# Patient Record
Sex: Female | Born: 2000
Health system: Southern US, Community
[De-identification: ages and names within clinical notes are randomized; demographics above are authoritative.]

## PROBLEM LIST (undated history)

## (undated) DIAGNOSIS — Z789 Other specified health status: Secondary | ICD-10-CM

## (undated) DIAGNOSIS — D649 Anemia, unspecified: Secondary | ICD-10-CM

## (undated) DIAGNOSIS — F419 Anxiety disorder, unspecified: Secondary | ICD-10-CM

## (undated) DIAGNOSIS — O4400 Placenta previa specified as without hemorrhage, unspecified trimester: Secondary | ICD-10-CM

## (undated) DIAGNOSIS — N390 Urinary tract infection, site not specified: Secondary | ICD-10-CM

## (undated) DIAGNOSIS — O24419 Gestational diabetes mellitus in pregnancy, unspecified control: Secondary | ICD-10-CM

## (undated) HISTORY — PX: NO PAST SURGERIES: SHX2092

## (undated) HISTORY — DX: Other specified health status: Z78.9

---

## 2020-08-23 ENCOUNTER — Ambulatory Visit (INDEPENDENT_AMBULATORY_CARE_PROVIDER_SITE_OTHER): Payer: No Typology Code available for payment source | Admitting: *Deleted

## 2020-08-23 ENCOUNTER — Other Ambulatory Visit: Payer: Self-pay

## 2020-08-23 VITALS — BP 117/76 | HR 96 | Temp 98.7°F | Wt 155.0 lb

## 2020-08-23 DIAGNOSIS — Z3400 Encounter for supervision of normal first pregnancy, unspecified trimester: Secondary | ICD-10-CM | POA: Diagnosis not present

## 2020-08-23 DIAGNOSIS — O0990 Supervision of high risk pregnancy, unspecified, unspecified trimester: Secondary | ICD-10-CM | POA: Insufficient documentation

## 2020-08-23 DIAGNOSIS — Z3201 Encounter for pregnancy test, result positive: Secondary | ICD-10-CM

## 2020-08-23 LAB — POCT URINE PREGNANCY: Preg Test, Ur: POSITIVE — AB

## 2020-08-23 NOTE — Patient Instructions (Signed)
AREA PEDIATRIC/FAMILY PRACTICE PHYSICIANS  [facility] [address] [place], [place] [address] Phone - [phone]   Fax - [phone]  [facility] [address] [place], [place]  [address] Phone - [phone]   Fax - [phone]  [facility] [address] [place], [place]  [address] Phone - [phone]   Fax - [phone]  [facility] [address] [place], [place]  [address] Phone - [phone]   Fax - [phone]  [facility] [address], [address] [place], [place]  [address] Phone - [phone]   Fax - [phone]  [facility] [address] [place], [place]  [address] Phone - [phone]   Fax - [phone]  [facility] [address] [place], [place]  [address] Phone - [phone]   Fax - [phone]  [facility] [address], [address] [place], [place]  [address] Phone - [phone]   Fax - [phone]  [facility] [address] [place], [place]  [address] Phone - [phone]   Fax - [phone] [facility] [address] [place], [place]  [address] Phone - [phone]   Fax - [phone]  [facility] [address] [place], [place]  [address] Phone - [phone]   Fax - [phone]  [facility] [address] [place], [place]  [address] Phone - [phone]   Fax - [phone]  [facility] [address] [place], [place]  [address] Phone - [phone]   Fax - [phone]  [facility]IL[facility] GOSRANI [address] [place], [place]  [address] Phone - [phone]  [facility] [address] [place], [place]  [address] Phone - [phone]   Fax - [phone]  [facility] [address] [place], [place]  [address] Phone - [phone]   Fax - [phone]  [facility] [address] [place], [place]  [address] Phone - [phone]   Fax - [phone]  [facility] [address] [place], [place]  [address] Phone - [phone]   Fax - [phone]   [facility] [address] [place], [place]  [address] Phone - [phone]   Fax - [phone]  [facility] [address] [place], [place]  [address] Phone - [phone]   Fax - [phone]  DAV[facility] RUB[facility] [address] [place], [place]  [address] Phone - [phone]   Fax - [phone]  [facility] [address] [place], [place]  [address] Phone - [phone]   Fax - [phone]  [facility] [address] [place], [place]  [address] Phone - [phone]   Fax - [phone] [facility] [address] [place], [place]  [address] Phone - [phone]   Fax - [phone]  [facility] [address] [place], [place]  [address] Phone - [phone]   Fax - [phone]   [facility] [address] [place],   [address] Phone - [phone]   Fax - [phone]   

## 2020-08-23 NOTE — Progress Notes (Signed)
   Location: [facility] Patient: clinic with Husband Provider: clinic  PRENATAL INTAKE SUMMARY  Amber Alvarez presents today New OB Nurse Interview.  OB History     Gravida  1   Para      Term      Preterm      AB      Living         SAB      IAB      Ectopic      Multiple      Live Births             I have reviewed the patient's medical, obstetrical, social, and family histories, medications, and available lab results.  SUBJECTIVE She has no unusual complaints. Patient desires female providers only Husband is MD at [facility]  OBJECTIVE Initial Nurse interview for history (New OB)  EDD: 02/26/2021 by LMP GA: [redacted]w[redacted]d G1P0   GENERAL APPEARANCE: alert, well appearing, in no apparent distress, oriented to person, place and time   ASSESSMENT Positive UPT Normal pregnancy  PLAN Prenatal care:  [facility] Labs to be completed at next visit with Camelia Eng, CNM  Follow Up Instructions:   I discussed the assessment and treatment plan with the patient. The patient was provided an opportunity to ask questions and all were answered. The patient agreed with the plan and demonstrated an understanding of the instructions.   The patient was advised to call back or seek an in-person evaluation if the symptoms worsen or if the condition fails to improve as anticipated.  I provided 40 minutes of  face-to-face time during this encounter.  Clovis Pu, RN

## 2020-09-07 ENCOUNTER — Ambulatory Visit (INDEPENDENT_AMBULATORY_CARE_PROVIDER_SITE_OTHER): Payer: No Typology Code available for payment source

## 2020-09-07 ENCOUNTER — Other Ambulatory Visit: Payer: Self-pay

## 2020-09-07 VITALS — BP 109/68 | HR 114 | Ht 66.0 in | Wt 156.0 lb

## 2020-09-07 DIAGNOSIS — Z789 Other specified health status: Secondary | ICD-10-CM

## 2020-09-07 DIAGNOSIS — Z3400 Encounter for supervision of normal first pregnancy, unspecified trimester: Secondary | ICD-10-CM

## 2020-09-07 NOTE — Progress Notes (Signed)
Subjective:   Amber Alvarez is a 20 y.o. G1P0 at [redacted]w[redacted]d by LMP being seen today for her first obstetrical visit. Her obstetrical history is significant for  healthy female  and has Supervision of normal first pregnancy, antepartum on their problem list.. Patient does intend to breast feed. Pregnancy history fully reviewed. This is a desired pregnancy.  Patient reports no complaints.  HISTORY: OB History  Gravida Para Term Preterm AB Living  1 0 0 0 0 0  SAB IAB Ectopic Multiple Live Births  0 0 0 0 0    # Outcome Date GA Lbr Len/2nd Weight Sex Delivery Anes PTL Lv  1 Current            Past Medical History:  Diagnosis Date   Medical history non-contributory    Past Surgical History:  Procedure Laterality Date   NO PAST SURGERIES     History reviewed. No pertinent family history. Social History   Tobacco Use   Smoking status: Never    Passive exposure: Never   Smokeless tobacco: Never  Vaping Use   Vaping Use: Never used  Substance Use Topics   Alcohol use: Never   Drug use: Never   No Known Allergies Current Outpatient Medications on File Prior to Visit  Medication Sig Dispense Refill   Prenatal Vit-Fe Fumarate-FA (PRENATAL VITAMIN PO) Take 1 tablet by mouth daily.     No current facility-administered medications on file prior to visit.   Indications for ASA therapy (per uptodate) One of the following: Previous pregnancy with preeclampsia, especially early onset and with an adverse outcome No Multifetal gestation No Chronic hypertension No Type 1 or 2 diabetes mellitus No Chronic kidney disease No Autoimmune disease (antiphospholipid syndrome, systemic lupus erythematosus) No  Two or more of the following: Nulliparity Yes Obesity (body mass index >30 kg/m2) No Family history of preeclampsia in mother or sister No Age ?35 years No Sociodemographic characteristics (African American race, low socioeconomic level) No Personal risk factors (eg, previous  pregnancy with low birth weight or small for gestational age infant, previous adverse pregnancy outcome [eg, stillbirth], interval >10 years between pregnancies) No  Indications for early 1 hour GTT (per uptodate)  BMI >25 (>23 in Asian women) AND one of the following  Gestational diabetes mellitus in a previous pregnancy No Glycated hemoglobin ?5.7 percent (39 mmol/mol), impaired glucose tolerance, or impaired fasting glucose on previous testing No First-degree relative with diabetes No High-risk race/ethnicity (eg, African American, Latino, Native American, [place] American, Pacific Islander) Yes History of cardiovascular disease No Hypertension or on therapy for hypertension No High-density lipoprotein cholesterol level <35 mg/dL (7.82 mmol/L) and/or a triglyceride level >250 mg/dL (4.23 mmol/L) No Polycystic ovary syndrome No Physical inactivity No Other clinical condition associated with insulin resistance (eg, severe obesity, acanthosis nigricans) No Previous birth of an infant weighing ?4000 g No Previous stillbirth of unknown cause No Exam   Vitals:   09/07/20 1524 09/07/20 1533  BP: 109/68   Pulse: (!) 114   Weight: 156 lb (70.8 kg)   Height:  5\' 6"  (1.676 m)   Fetal Heart Rate (bpm): 143  Uterus:     Pelvic Exam: Perineum: deferred   Vulva: deferred   Vagina:  deferred   Cervix: deferred   Adnexa: deferred   Bony Pelvis: deferred  System: General: well-developed, well-nourished female in no acute distress   Breast:  deferred   Skin: normal coloration and turgor, no rashes   Neurologic: oriented, normal,  negative, normal mood   Extremities: normal strength, tone, and muscle mass, ROM of all joints is normal   HEENT PERRLA, extraocular movement intact and sclera clear, anicteric   Mouth/Teeth mucous membranes moist, dental hygiene good   Neck supple    Cardiovascular: regular rate   Respiratory:  Effort normal, no respiratory distress   Abdomen: soft, non-tender;  no masses, no organomegaly     Assessment:   Pregnancy: G1P0 Patient Active Problem List   Diagnosis Date Noted   Supervision of normal first pregnancy, antepartum 08/23/2020     Plan:  1. Supervision of normal first pregnancy, antepartum - Routine OB. Doing well. No complaints today - Intermittent nausea, offered medications, but declines. May use vitamin B6 and unisom as well as non pharmacological methods including ginger, peppermint, seabands prn - Intermittent headaches. Does not take Tylenol. Reassured patient that she may use in pregnancy. Encouraged regular meals and ensure adequate water intake.   - [place] MFM OB COMP + 14 WK; Future - Hemoglobin A1c - CBC/D/Plt+RPR+Rh+ABO+RubIgG... - Culture, OB Urine - AFP, Serum, Open Spina Bifida   2. Language Barrier - Desires husband to interpret when possible   Initial labs drawn. Continue prenatal vitamins. Discussed and offered genetic screening options, including Quad screen/AFP, NIPS testing, and option to decline testing. Benefits/risks/alternatives reviewed. Pt aware that anatomy US is form of genetic screening with lower accuracy in detecting trisomies than blood work.  Pt chooses/declines genetic screening today. NIPS: requested. Ultrasound discussed; fetal anatomic survey: ordered. Problem list reviewed and updated. The nature of [facility] with multiple MDs and other Advanced Practice Providers was explained to patient; also emphasized that residents, students are part of our team. Routine obstetric precautions reviewed.  Follow up in 4 weeks   Brand Males, CNM 09/07/20 3:49 PM

## 2020-09-09 LAB — CBC/D/PLT+RPR+RH+ABO+RUBIGG...
Antibody Screen: NEGATIVE
Basophils Absolute: 0 10*3/uL (ref 0.0–0.2)
Basos: 0 %
EOS (ABSOLUTE): 0 10*3/uL (ref 0.0–0.4)
Eos: 0 %
HCV Ab: <0.1 s/co ratio (ref 0.0–0.9)
HIV Screen 4th Generation wRfx: NONREACTIVE
Hematocrit: 37.1 % (ref 34.0–46.6)
Hemoglobin: 12.5 g/dL (ref 11.1–15.9)
Hepatitis B Surface Ag: NEGATIVE
Immature Grans (Abs): 0 10*3/uL (ref 0.0–0.1)
Immature Granulocytes: 0 %
Lymphocytes Absolute: 1.9 10*3/uL (ref 0.7–3.1)
Lymphs: 20 %
MCH: 30.9 pg (ref 26.6–33.0)
MCHC: 33.7 g/dL (ref 31.5–35.7)
MCV: 92 fL (ref 79–97)
Monocytes Absolute: 0.5 10*3/uL (ref 0.1–0.9)
Monocytes: 6 %
Neutrophils Absolute: 7.1 10*3/uL — ABNORMAL HIGH (ref 1.4–7.0)
Neutrophils: 74 %
Platelets: 314 10*3/uL (ref 150–450)
RBC: 4.05 x10E6/uL (ref 3.77–5.28)
RDW: 12.5 % (ref 11.7–15.4)
RPR Ser Ql: NONREACTIVE
Rh Factor: POSITIVE
Rubella Antibodies, IGG: 13.9 index (ref >0.99)
WBC: 9.6 10*3/uL (ref 3.4–10.8)

## 2020-09-09 LAB — HCV INTERPRETATION

## 2020-09-09 LAB — AFP, SERUM, OPEN SPINA BIFIDA
AFP MoM: 0.52
AFP Value: 15.9 ng/mL
Gest. Age on Collection Date: 15.3 weeks
Maternal Age At EDD: 21.3 yr
OSBR Risk 1 IN: 10000
Test Results:: NEGATIVE
Weight: 156 [lb_av]

## 2020-09-09 LAB — HEMOGLOBIN A1C
Est. average glucose Bld gHb Est-mCnc: 108 mg/dL
Hgb A1c MFr Bld: 5.4 % (ref 4.8–5.6)

## 2020-09-09 LAB — URINE CULTURE, OB REFLEX

## 2020-09-09 LAB — CULTURE, OB URINE

## 2020-10-01 ENCOUNTER — Ambulatory Visit: Payer: No Typology Code available for payment source

## 2020-10-01 ENCOUNTER — Other Ambulatory Visit: Payer: Self-pay

## 2020-10-01 DIAGNOSIS — Z3400 Encounter for supervision of normal first pregnancy, unspecified trimester: Secondary | ICD-10-CM | POA: Insufficient documentation

## 2020-10-05 ENCOUNTER — Other Ambulatory Visit: Payer: Self-pay | Admitting: *Deleted

## 2020-10-05 DIAGNOSIS — Z3492 Encounter for supervision of normal pregnancy, unspecified, second trimester: Secondary | ICD-10-CM

## 2020-10-07 ENCOUNTER — Ambulatory Visit: Payer: No Typology Code available for payment source

## 2020-10-07 ENCOUNTER — Encounter: Payer: No Typology Code available for payment source | Admitting: Student

## 2020-10-19 ENCOUNTER — Other Ambulatory Visit: Payer: Self-pay

## 2020-10-19 ENCOUNTER — Ambulatory Visit (INDEPENDENT_AMBULATORY_CARE_PROVIDER_SITE_OTHER): Payer: No Typology Code available for payment source

## 2020-10-19 VITALS — BP 110/62 | HR 79 | Wt 163.3 lb

## 2020-10-19 DIAGNOSIS — Z3400 Encounter for supervision of normal first pregnancy, unspecified trimester: Secondary | ICD-10-CM

## 2020-10-19 DIAGNOSIS — N949 Unspecified condition associated with female genital organs and menstrual cycle: Secondary | ICD-10-CM

## 2020-10-19 NOTE — Progress Notes (Signed)
Patient reports occasional cramping "in ligaments" when she is walking, however the pain has subsided. She also reports headaches ever 2-3 days.   Dawayne Patricia, CMA  10/19/20

## 2020-10-19 NOTE — Progress Notes (Signed)
   PRENATAL VISIT NOTE  Subjective:  Amber Alvarez is a 20 y.o. G1P0 at [redacted]w[redacted]d being seen today for ongoing prenatal care.  She is currently monitored for the following issues for this low-risk pregnancy and has Supervision of normal first pregnancy, antepartum on their problem list.  Patient reports intermittent bilateral sharp lower abdominal pain that occurs while walking or changing positions too quickly. Started approximately 1-1.5 weeks ago. Lying down helps.  Contractions: Not present. Vag. Bleeding: None.  Movement: Present. Denies leaking of fluid.   The following portions of the patient's history were reviewed and updated as appropriate: allergies, current medications, past family history, past medical history, past social history, past surgical history and problem list.   Objective:   Vitals:   10/19/20 1428  BP: 110/62  Pulse: 79  Weight: 163 lb 4.8 oz (74.1 kg)    Fetal Status: Fetal Heart Rate (bpm): 146   Movement: Present     General:  Alert, oriented and cooperative. Patient is in no acute distress.  Skin: Skin is warm and dry. No rash noted.   Cardiovascular: Normal heart rate noted  Respiratory: Normal respiratory effort, no problems with respiration noted  Abdomen: Soft, gravid, appropriate for gestational age.  Pain/Pressure: Present     Pelvic: Cervical exam deferred        Extremities: Normal range of motion.  Edema: None  Mental Status: Normal mood and affect. Normal behavior. Normal judgment and thought content.   Assessment and Plan:  Pregnancy: G1P0 at [redacted]w[redacted]d  1. Supervision of normal first pregnancy, antepartum - Doing well - Routine OB care - Anticipatory guidance for upcoming appointments provided - FHTs present; fundal height at umbilicus - BP normotensive - Husband translated during appointment  2. Round ligament pain - Bilateral sharp intermittent lower abdominal pain, worsened with walking or changing positions - Reassurance provided -  Encouraged changing positions slowly, stretching, heat/ice, Tylenol and support belt prn   Preterm labor symptoms and general obstetric precautions including but not limited to vaginal bleeding, contractions, leaking of fluid and fetal movement were reviewed in detail with the patient. Please refer to After Visit Summary for other counseling recommendations.   Return in about 4 weeks (around 11/16/2020).  Future Appointments  Date Time Provider Department Center  10/28/2020  3:45 PM [facility]  11/16/2020  3:55 PM Allayne Stack, DO [facility]  01/14/2021  2:40 PM Myrlene Broker, MD [facility] None     Brand Males, CNM 10/19/20 4:12 PM

## 2020-10-27 ENCOUNTER — Telehealth: Payer: Self-pay

## 2020-10-27 NOTE — Telephone Encounter (Signed)
Left message for patient to call the office - need to reschedule her appt. On Tuesday 10/4 @ 3:45pm - duplicate patients scheduled in the same slot at the same time.

## 2020-10-28 ENCOUNTER — Ambulatory Visit: Payer: No Typology Code available for payment source

## 2020-10-28 ENCOUNTER — Telehealth: Payer: Self-pay

## 2020-10-28 NOTE — Telephone Encounter (Signed)
Left message for the patient to call the office to reschedule overbook appointment.

## 2020-11-02 ENCOUNTER — Ambulatory Visit: Payer: No Typology Code available for payment source

## 2020-11-05 ENCOUNTER — Other Ambulatory Visit: Payer: Self-pay

## 2020-11-05 ENCOUNTER — Ambulatory Visit: Payer: No Typology Code available for payment source | Attending: Obstetrics

## 2020-11-05 DIAGNOSIS — Z3A23 23 weeks gestation of pregnancy: Secondary | ICD-10-CM

## 2020-11-05 DIAGNOSIS — Z362 Encounter for other antenatal screening follow-up: Secondary | ICD-10-CM | POA: Diagnosis not present

## 2020-11-05 DIAGNOSIS — Z3492 Encounter for supervision of normal pregnancy, unspecified, second trimester: Secondary | ICD-10-CM | POA: Insufficient documentation

## 2020-11-16 ENCOUNTER — Encounter: Payer: No Typology Code available for payment source | Admitting: Family Medicine

## 2020-11-25 ENCOUNTER — Ambulatory Visit
Admission: EM | Admit: 2020-11-25 | Discharge: 2020-11-25 | Disposition: A | Discharge status: Home / Self Care | Payer: No Typology Code available for payment source | Attending: Internal Medicine | Admitting: Internal Medicine

## 2020-11-25 ENCOUNTER — Encounter: Payer: Self-pay | Admitting: Emergency Medicine

## 2020-11-25 ENCOUNTER — Other Ambulatory Visit: Payer: Self-pay

## 2020-11-25 DIAGNOSIS — H60391 Other infective otitis externa, right ear: Secondary | ICD-10-CM

## 2020-11-25 DIAGNOSIS — O99891 Other specified diseases and conditions complicating pregnancy: Secondary | ICD-10-CM

## 2020-11-25 DIAGNOSIS — H6121 Impacted cerumen, right ear: Secondary | ICD-10-CM

## 2020-11-25 DIAGNOSIS — Z3A00 Weeks of gestation of pregnancy not specified: Secondary | ICD-10-CM

## 2020-11-25 MED ORDER — NEOMYCIN-POLYMYXIN-HC 3.5-10000-1 OT SUSP: 4.0000 [drp] | Freq: Three times a day (TID) | OTIC | 0 refills | Status: DC

## 2020-11-25 NOTE — ED Provider Notes (Addendum)
[facility]    CSN: [id number] Arrival date & time: 11/25/20  1905      History   Chief Complaint Chief Complaint  Patient presents with   Otalgia    HPI Amber Alvarez is a 20 y.o. female.   Patient presents with decreased hearing to the right ear and patient contributes this to earwax.  Patient and family member report that they have tried to clean out her ear with a Q-tip, have used Debrox, and water with a syringe for 2 days but this has not provided any relief.  Denies any drainage from the ear or any pain to the ear.  Denies any upper respiratory symptoms or fever.   Otalgia  Past Medical History:  Diagnosis Date   Medical history non-contributory     Patient Active Problem List   Diagnosis Date Noted   Supervision of normal first pregnancy, antepartum 08/23/2020    Past Surgical History:  Procedure Laterality Date   NO PAST SURGERIES      OB History     Gravida  1   Para      Term      Preterm      AB      Living         SAB      IAB      Ectopic      Multiple      Live Births               Home Medications    Prior to Admission medications   Medication Sig Start Date End Date Taking? Authorizing Provider  neomycin-polymyxin-hydrocortisone (CORTISPORIN) 3.5-10000-1 OTIC suspension Place 4 drops into the right ear 3 (three) times daily. 11/25/20  Yes Breslin Burklow, Acie Fredrickson, FNP  Prenatal Vit-Fe Fumarate-FA (PRENATAL VITAMIN PO) Take 1 tablet by mouth daily.   Yes [provider]    Family History No family history on file.  Social History Social History   Tobacco Use   Smoking status: Never    Passive exposure: Never   Smokeless tobacco: Never  Vaping Use   Vaping Use: Never used  Substance Use Topics   Alcohol use: Never   Drug use: Never     Allergies   Patient has no known allergies.   Review of Systems Review of Systems Per HPI  Physical Exam Triage Vital Signs ED Triage Vitals  Enc  Vitals Group     BP 11/25/20 1949 113/76     Pulse Rate 11/25/20 1949 90     Resp 11/25/20 1949 18     Temp 11/25/20 1949 98.1 F (36.7 C)     Temp Source 11/25/20 1949 Oral     SpO2 11/25/20 1949 98 %     Weight --      Height --      Head Circumference --      Peak Flow --      Pain Score 11/25/20 1950 0     Pain Loc --      Pain Edu? --      Excl. in GC? --    No data found.  Updated Vital Signs BP 113/76 (BP Location: Left Arm)   Pulse 90   Temp 98.1 F (36.7 C) (Oral)   Resp 18   LMP 05/22/2020 (Exact Date)   SpO2 98%   Visual Acuity Right Eye Distance:   Left Eye Distance:   Bilateral Distance:    Right Eye Near:  Left Eye Near:    Bilateral Near:     Physical Exam Constitutional:      General: She is not in acute distress.    Appearance: Normal appearance. She is not toxic-appearing or diaphoretic.  HENT:     Head: Normocephalic and atraumatic.     Right Ear: External ear normal. Tenderness present. No drainage or swelling. No middle ear effusion. There is impacted cerumen.     Left Ear: Tympanic membrane, ear canal and external ear normal.     Ears:     Comments: Unable to visualize tympanic membrane of right ear due to impacted cerumen.  Erythematous external canal right ear. Eyes:     Extraocular Movements: Extraocular movements intact.     Conjunctiva/sclera: Conjunctivae normal.  Pulmonary:     Effort: Pulmonary effort is normal.  Neurological:     General: No focal deficit present.     Mental Status: She is alert and oriented to person, place, and time. Mental status is at baseline.  Psychiatric:        Mood and Affect: Mood normal.        Behavior: Behavior normal.        Thought Content: Thought content normal.        Judgment: Judgment normal.     UC Treatments / Results  Labs (all labs ordered are listed, but only abnormal results are displayed) Labs Reviewed - No data to display  EKG   Radiology No results  found.  Procedures Procedures (including critical care time)  Medications Ordered in UC Medications - No data to display  Initial Impression / Assessment and Plan / UC Course  I have reviewed the triage vital signs and the nursing notes.  Pertinent labs & imaging results that were available during my care of the patient were reviewed by me and considered in my medical decision making (see chart for details).     Ear irrigation to remove impacted cerumen was attempted.  Patient declined any further attempts due to pain.  Unable to remove with curette due to location of cerumen and its impaction.  Patient was advised that she will need to follow-up with an ear, nose, throat doctor for further evaluation and management to remove impacted cerumen.  Will prescribe Cortisporin antibiotic eardrops for otitis externa.  This is safe with pregnancy due to research that has had been conducted and limited systemic absorption.  Discussed strict return precautions.  Patient and family voiced understanding and were agreeable with plan.  Patient wished for family member to interpret for her. Final Clinical Impressions(s) / UC Diagnoses   Final diagnoses:  Impacted cerumen of right ear  Infective otitis externa of right ear     Discharge Instructions      An antibiotic eardrop has been prescribed.  Please follow-up with the provided contact information for ear, nose, throat doctor for further evaluation and management.     ED Prescriptions     Medication Sig Dispense Auth. Provider   neomycin-polymyxin-hydrocortisone (CORTISPORIN) 3.5-10000-1 OTIC suspension Place 4 drops into the right ear 3 (three) times daily. 10 mL Gustavus Bryant, [place]      PDMP not reviewed this encounter.   Gustavus Bryant, [place] 11/25/20 2044    Gustavus Bryant, [place] 11/25/20 2045

## 2020-11-25 NOTE — ED Triage Notes (Signed)
Patient was cleaning her right ear with a qtip and it got stuck.  Patient's husband has been using debrox, water and a syringe trying to give relief x 2 days.

## 2020-11-25 NOTE — Discharge Instructions (Signed)
An antibiotic eardrop has been prescribed.  Please follow-up with the provided contact information for ear, nose, throat doctor for further evaluation and management.

## 2020-11-30 ENCOUNTER — Inpatient Hospital Stay (HOSPITAL_COMMUNITY)
Admission: AD | Admit: 2020-11-30 | Discharge: 2020-11-30 | Disposition: A | Discharge status: Home / Self Care | Payer: No Typology Code available for payment source | Attending: Obstetrics and Gynecology | Admitting: Obstetrics and Gynecology

## 2020-11-30 ENCOUNTER — Other Ambulatory Visit: Payer: Self-pay

## 2020-11-30 ENCOUNTER — Encounter (HOSPITAL_COMMUNITY): Payer: Self-pay | Admitting: Obstetrics and Gynecology

## 2020-11-30 DIAGNOSIS — Z3689 Encounter for other specified antenatal screening: Secondary | ICD-10-CM

## 2020-11-30 DIAGNOSIS — O368120 Decreased fetal movements, second trimester, not applicable or unspecified: Secondary | ICD-10-CM | POA: Diagnosis not present

## 2020-11-30 DIAGNOSIS — Z3A26 26 weeks gestation of pregnancy: Secondary | ICD-10-CM | POA: Diagnosis not present

## 2020-11-30 DIAGNOSIS — Z3400 Encounter for supervision of normal first pregnancy, unspecified trimester: Secondary | ICD-10-CM

## 2020-11-30 NOTE — MAU Note (Signed)
PT SAYS WITH HUSBAND INTERPRETING- GHALIB- SAYS SLIGHT  MOVEMENT  SINCE Monday AT 0700 [place] - [phone] [facility]- CLINIC

## 2020-11-30 NOTE — MAU Note (Signed)
Fetal movement noted per pt-marked on tracing- FHR tracing appropriate for gestational age. Pt home with instructions

## 2020-11-30 NOTE — MAU Provider Note (Signed)
History     CSN: [id number]  Arrival date and time: 11/30/20 0127   Event Date/Time   First Provider Initiated Contact with Patient 11/30/20 0229      Chief Complaint  Patient presents with   Decreased Fetal Movement   Ms. Amber Alvarez is a 20 y.o. year old G1P0 female at [redacted]w[redacted]d weeks gestation who presents to MAU reporting she has only felt slight movement since Monday morning at 0700. Her husband called [facility] after hours line and was instructed to bring her here. She denies contractions, VB or abnormal vaginal discharge. She receives [facility] with [facility]; next appt is 12/08/20. Her spouse is present and contributing to the history taking and interpreting.    OB History     Gravida  1   Para      Term      Preterm      AB      Living         SAB      IAB      Ectopic      Multiple      Live Births              Past Medical History:  Diagnosis Date   Medical history non-contributory     Past Surgical History:  Procedure Laterality Date   NO PAST SURGERIES      History reviewed. No pertinent family history.  Social History   Tobacco Use   Smoking status: Never    Passive exposure: Never   Smokeless tobacco: Never  Vaping Use   Vaping Use: Never used  Substance Use Topics   Alcohol use: Never   Drug use: Never    Allergies: No Known Allergies  Medications Prior to Admission  Medication Sig Dispense Refill Last Dose   neomycin-polymyxin-hydrocortisone (CORTISPORIN) 3.5-10000-1 OTIC suspension Place 4 drops into the right ear 3 (three) times daily. 10 mL 0 11/29/2020   Prenatal Vit-Fe Fumarate-FA (PRENATAL VITAMIN PO) Take 1 tablet by mouth daily.   11/29/2020    Review of Systems  Constitutional: Negative.   HENT: Negative.    Eyes: Negative.   Respiratory: Negative.    Cardiovascular: Negative.   Gastrointestinal: Negative.   Endocrine: Negative.   Genitourinary:        "Only felt slight FM today"  Musculoskeletal: Negative.   Skin:  Negative.   Allergic/Immunologic: Negative.   Neurological: Negative.   Hematological: Negative.   Psychiatric/Behavioral: Negative.    Physical Exam   Blood pressure 110/66, pulse 89, temperature 98 F (36.7 C), temperature source Oral, resp. rate 18, height 5\' 4"  (1.626 m), weight 77.6 kg, last menstrual period 05/22/2020.  Physical Exam Vitals and nursing note reviewed.  Constitutional:      Appearance: Normal appearance. She is normal weight.  Cardiovascular:     Rate and Rhythm: Normal rate.  Pulmonary:     Effort: Pulmonary effort is normal.  Abdominal:     General: There is no distension.     Palpations: Abdomen is soft.     Tenderness: There is no abdominal tenderness.  Genitourinary:    Comments: deferred Musculoskeletal:        General: Normal range of motion.  Skin:    General: Skin is warm and dry.  Neurological:     Mental Status: She is alert and oriented to person, place, and time.  Psychiatric:        Mood and Affect: Mood normal.  Behavior: Behavior normal.        Thought Content: Thought content normal.        Judgment: Judgment normal.   REACTIVE NST - FHR: 140 bpm / moderate variability / accels present / decels absent / TOCO: none  MAU Course  Procedures  MDM EFM - Discussed FHR tracing with patient and spouse. Patient and spouse able to determine good FM on monitor and patient's can perceive FM.  Assessment and Plan  NST (non-stress test) reactive  [redacted] weeks gestation of pregnancy   - Discharge patient - Keep scheduled appt of 12/08/2020 - Patient verbalized an understanding of the plan of care and agrees.    Raelyn Mora, CNM 11/30/2020, 2:30 AM

## 2020-11-30 NOTE — MAU Note (Signed)
Movement clicker given to patient with explanation. Audible movements noted.

## 2020-12-01 ENCOUNTER — Encounter: Payer: Self-pay | Admitting: *Deleted

## 2020-12-06 ENCOUNTER — Other Ambulatory Visit: Payer: Self-pay | Admitting: *Deleted

## 2020-12-06 DIAGNOSIS — Z3400 Encounter for supervision of normal first pregnancy, unspecified trimester: Secondary | ICD-10-CM

## 2020-12-08 ENCOUNTER — Other Ambulatory Visit: Payer: No Typology Code available for payment source

## 2020-12-08 ENCOUNTER — Ambulatory Visit (INDEPENDENT_AMBULATORY_CARE_PROVIDER_SITE_OTHER): Payer: No Typology Code available for payment source | Admitting: Obstetrics and Gynecology

## 2020-12-08 ENCOUNTER — Other Ambulatory Visit: Payer: Self-pay

## 2020-12-08 VITALS — BP 107/62 | HR 90 | Wt 173.0 lb

## 2020-12-08 DIAGNOSIS — Z3A27 27 weeks gestation of pregnancy: Secondary | ICD-10-CM | POA: Diagnosis not present

## 2020-12-08 DIAGNOSIS — Z23 Encounter for immunization: Secondary | ICD-10-CM | POA: Diagnosis not present

## 2020-12-08 DIAGNOSIS — Z3400 Encounter for supervision of normal first pregnancy, unspecified trimester: Secondary | ICD-10-CM

## 2020-12-08 NOTE — Progress Notes (Signed)
   PRENATAL VISIT NOTE  Subjective:  Amber Alvarez is a 20 y.o. G1P0 at [redacted]w[redacted]d being seen today for ongoing prenatal care.  She is currently monitored for the following issues for this low-risk pregnancy and has Supervision of normal first pregnancy, antepartum on their problem list.  Patient reports no complaints.  Contractions: Not present. Vag. Bleeding: None.  Movement: Present. Denies leaking of fluid.   The following portions of the patient's history were reviewed and updated as appropriate: allergies, current medications, past family history, past medical history, past social history, past surgical history and problem list.   Objective:   Vitals:   12/08/20 0835  BP: 107/62  Pulse: 90  Weight: 173 lb (78.5 kg)    Fetal Status: Fetal Heart Rate (bpm): 155 Fundal Height: 29 cm Movement: Present     General:  Alert, oriented and cooperative. Patient is in no acute distress.  Skin: Skin is warm and dry. No rash noted.   Cardiovascular: Normal heart rate noted  Respiratory: Normal respiratory effort, no problems with respiration noted  Abdomen: Soft, gravid, appropriate for gestational age.  Pain/Pressure: Absent     Pelvic: Cervical exam deferred        Extremities: Normal range of motion.  Edema: None  Mental Status: Normal mood and affect. Normal behavior. Normal judgment and thought content.   Assessment and Plan:  Pregnancy: G1P0 at [redacted]w[redacted]d 1. [redacted] weeks gestation of pregnancy  - Declined flu vaccine today, would like to get at her next visit - Patient did not come fasting today for 2 hour GTT, she will reschedule  - Tdap vaccine greater than or equal to 7yo IM - Reviewed Healthy baby website for classes and hospital tour   Preterm labor symptoms and general obstetric precautions including but not limited to vaginal bleeding, contractions, leaking of fluid and fetal movement were reviewed in detail with the patient. Please refer to After Visit Summary for other counseling  recommendations.   Return in about 2 weeks (around 12/22/2020), or For 2 hour GTT, come fasting..  Future Appointments  Date Time Provider Department Center  12/21/2020  8:20 AM [facility]  12/21/2020  8:55 AM Allayne Stack, DO [facility]  01/14/2021  2:40 PM Myrlene Broker, MD [facility] None    Venia Carbon, NP

## 2020-12-08 NOTE — Progress Notes (Signed)
Patient is here for a routine prenatal appointment. Amber Alvarez was scheduled for a 2 hour glucose tolerance test today but could not start due to her drinking "tea and milk" earlier this morning. Patient has been made aware that her test will be rescheduled upon check out.   Tdap administered into left deltoid without any complications. There were no questions or concerns

## 2020-12-21 ENCOUNTER — Encounter: Payer: Self-pay | Admitting: Family Medicine

## 2020-12-21 ENCOUNTER — Other Ambulatory Visit: Payer: No Typology Code available for payment source

## 2020-12-21 ENCOUNTER — Other Ambulatory Visit: Payer: Self-pay

## 2020-12-21 ENCOUNTER — Ambulatory Visit (INDEPENDENT_AMBULATORY_CARE_PROVIDER_SITE_OTHER): Payer: No Typology Code available for payment source | Admitting: Family Medicine

## 2020-12-21 VITALS — BP 106/71 | HR 87 | Wt 176.8 lb

## 2020-12-21 DIAGNOSIS — Z3400 Encounter for supervision of normal first pregnancy, unspecified trimester: Secondary | ICD-10-CM

## 2020-12-21 DIAGNOSIS — Z3A29 29 weeks gestation of pregnancy: Secondary | ICD-10-CM

## 2020-12-21 NOTE — Progress Notes (Signed)
Patient in for routine prenatal visit, reports no issues or concerns. Good fetal movement. Having 28 week labs completed today. Tdap vaccine given previously.  Wynona Canes, CMA

## 2020-12-21 NOTE — Progress Notes (Signed)
    Subjective:  Amber Alvarez is a 20 y.o. G1P0 at [redacted]w[redacted]d being seen today for ongoing prenatal care.  She is currently monitored for the following issues for this low-risk pregnancy and has Supervision of normal first pregnancy, antepartum on their problem list.  Patient reports some right upper cramping whenever she lays on her back. Resolves if she sits up or lays onto her left side. Denies any HA, blurred vision, LE edema, N/V.   Contractions: Not present. Vag. Bleeding: None.  Movement: Present. Denies leaking of fluid.   The following portions of the patient's history were reviewed and updated as appropriate: allergies, current medications, past family history, past medical history, past social history, past surgical history and problem list.   Objective:   Vitals:   12/21/20 0835  BP: 106/71  Pulse: 87  Weight: 176 lb 12.5 oz (80.2 kg)    Fetal Status: Fetal Heart Rate (bpm): 141 Fundal Height: 29 cm Movement: Present     General:  Alert, oriented and cooperative. Patient is in no acute distress.  Skin: Skin is warm and dry. No rash noted.   Cardiovascular: Normal heart rate noted  Respiratory: Normal respiratory effort, no problems with respiration noted  Abdomen: Soft and non-tender in RUQ, gravid, appropriate for gestational age. Pain/Pressure: Present     Pelvic:  Cervical exam deferred        Extremities: Normal range of motion.  Edema: None  Mental Status: Normal mood and affect. Normal behavior. Normal judgment and thought content.    Assessment and Plan:  Pregnancy: G1P0 at [redacted]w[redacted]d  1. Supervision of normal first pregnancy, antepartum Doing well, normal fetal movement. Undecided on contraception, discussed options.   2. [redacted] weeks gestation of pregnancy Third trimester labs collected, has already received her flu and Tdap vaccine.   3. RUQ abdominal cramping  Only when laying flat, suspect compression of abdomen upwards. No concerning s/sx, BP WNL. Recommended  avoiding laying flat for prolonged periods as her pregnancy progresses.   4. Language barrier Husband provided interpretation for today's visit.   Preterm labor symptoms and general obstetric precautions including but not limited to vaginal bleeding, contractions, leaking of fluid and fetal movement were reviewed in detail with the patient. Please refer to After Visit Summary for other counseling recommendations.   Return in about 2 weeks (around 01/04/2021) for LROB.   Allayne Stack, DO

## 2020-12-22 LAB — GLUCOSE TOLERANCE, 2 HOURS W/ 1HR
Glucose, 1 hour: 187 mg/dL — ABNORMAL HIGH (ref 70–179)
Glucose, 2 hour: 166 mg/dL — ABNORMAL HIGH (ref 70–152)
Glucose, Fasting: 84 mg/dL (ref 70–91)

## 2020-12-22 LAB — CBC
Hematocrit: 36.2 % (ref 34.0–46.6)
Hemoglobin: 12.5 g/dL (ref 11.1–15.9)
MCH: 31.4 pg (ref 26.6–33.0)
MCHC: 34.5 g/dL (ref 31.5–35.7)
MCV: 91 fL (ref 79–97)
Platelets: 277 10*3/uL (ref 150–450)
RBC: 3.98 x10E6/uL (ref 3.77–5.28)
RDW: 12.1 % (ref 11.7–15.4)
WBC: 10.1 10*3/uL (ref 3.4–10.8)

## 2020-12-22 LAB — RPR: RPR Ser Ql: NONREACTIVE

## 2020-12-22 LAB — HIV ANTIBODY (ROUTINE TESTING W REFLEX): HIV Screen 4th Generation wRfx: NONREACTIVE

## 2020-12-28 ENCOUNTER — Encounter: Payer: Self-pay | Admitting: Obstetrics and Gynecology

## 2020-12-28 ENCOUNTER — Other Ambulatory Visit: Payer: Self-pay | Admitting: Obstetrics and Gynecology

## 2020-12-28 DIAGNOSIS — O24419 Gestational diabetes mellitus in pregnancy, unspecified control: Secondary | ICD-10-CM

## 2020-12-28 DIAGNOSIS — O24410 Gestational diabetes mellitus in pregnancy, diet controlled: Secondary | ICD-10-CM

## 2020-12-28 DIAGNOSIS — O133 Gestational [pregnancy-induced] hypertension without significant proteinuria, third trimester: Secondary | ICD-10-CM

## 2020-12-28 DIAGNOSIS — O139 Gestational [pregnancy-induced] hypertension without significant proteinuria, unspecified trimester: Secondary | ICD-10-CM | POA: Insufficient documentation

## 2020-12-28 NOTE — Progress Notes (Signed)
Gestational diabetes diagnoses. Mychart message sent. Referral to diabetes education sent. MFM [place] ordered for growth.   Duane Lope, NP 12/28/2020 1:31 PM

## 2021-01-06 ENCOUNTER — Encounter: Payer: No Typology Code available for payment source | Admitting: Certified Nurse Midwife

## 2021-01-12 ENCOUNTER — Inpatient Hospital Stay (HOSPITAL_COMMUNITY)
Admission: AD | Admit: 2021-01-12 | Discharge: 2021-01-12 | Disposition: A | Discharge status: Home / Self Care | Payer: No Typology Code available for payment source | Attending: Obstetrics & Gynecology | Admitting: Obstetrics & Gynecology

## 2021-01-12 ENCOUNTER — Encounter (HOSPITAL_COMMUNITY): Payer: Self-pay | Admitting: Obstetrics & Gynecology

## 2021-01-12 ENCOUNTER — Other Ambulatory Visit: Payer: Self-pay

## 2021-01-12 DIAGNOSIS — O368130 Decreased fetal movements, third trimester, not applicable or unspecified: Secondary | ICD-10-CM | POA: Diagnosis present

## 2021-01-12 DIAGNOSIS — Z3689 Encounter for other specified antenatal screening: Secondary | ICD-10-CM

## 2021-01-12 DIAGNOSIS — Z3A32 32 weeks gestation of pregnancy: Secondary | ICD-10-CM | POA: Diagnosis not present

## 2021-01-12 NOTE — MAU Note (Signed)
Pt reports she has not felt the baby move today.   Denies vaginal bleeding Denies LOF.

## 2021-01-12 NOTE — MAU Provider Note (Signed)
Chief Complaint:  Decreased Fetal Movement   Event Date/Time   First Provider Initiated Contact with Patient 01/12/21 2239     HPI: Amber Alvarez is a 20 y.o. G1P0 at 45w5dwho presents to maternity admissions reporting no fetal movement today.  States feels movement now.  Husband acted as Engineer, technical sales (he is MD at [employer] [facility] [employer]). She denies LOF, vaginal bleeding, h/a, n/v, diarrhea, constipation or fever/chills. Reports some pelvic pressure but declines exam  Other This is a new problem. The current episode started today. The problem has been gradually improving. Pertinent negatives include no abdominal pain, chills, fever or myalgias. Nothing aggravates the symptoms. She has tried nothing for the symptoms.   RN Note: Pt reports she has not felt the baby move today.   Denies vaginal bleeding Denies LOF.  Past Medical History: Past Medical History:  Diagnosis Date   Medical history non-contributory     Past obstetric history: OB History  Gravida Para Term Preterm AB Living  1            SAB IAB Ectopic Multiple Live Births               # Outcome Date GA Lbr Len/2nd Weight Sex Delivery Anes PTL Lv  1 Current             Past Surgical History: Past Surgical History:  Procedure Laterality Date   NO PAST SURGERIES      Family History: History reviewed. No pertinent family history.  Social History: Social History   Tobacco Use   Smoking status: Never    Passive exposure: Never   Smokeless tobacco: Never  Vaping Use   Vaping Use: Never used  Substance Use Topics   Alcohol use: Never   Drug use: Never    Allergies: No Known Allergies  Meds:  Medications Prior to Admission  Medication Sig Dispense Refill Last Dose   Prenatal Vit-Fe Fumarate-FA (PRENATAL VITAMIN PO) Take 1 tablet by mouth daily.   01/12/2021   neomycin-polymyxin-hydrocortisone (CORTISPORIN) 3.5-10000-1 OTIC suspension Place 4 drops into the right ear 3 (three) times daily. (Patient not taking: Reported on  12/08/2020) 10 mL 0     I have reviewed patient's Past Medical Hx, Surgical Hx, Family Hx, Social Hx, medications and allergies.   ROS:  Review of Systems  Constitutional:  Negative for chills and fever.  Gastrointestinal:  Negative for abdominal pain.  Musculoskeletal:  Negative for myalgias.  Other systems negative  Physical Exam  Patient Vitals for the past 24 hrs:  BP Temp Pulse Resp SpO2  01/12/21 2200 119/76 -- (!) 101 -- --  01/12/21 2159 -- 98.3 F (36.8 C) -- 20 98 %   Constitutional: Well-developed, well-nourished female in no acute distress.  Cardiovascular: normal rate and rhythm Respiratory: normal effort, GI: Abd soft, non-tender, gravid appropriate for gestational age.   No rebound or guarding. MS: Extremities nontender, no edema, normal ROM Neurologic: Alert and oriented x 4.  GU: Neg CVAT.  Declines vaginal exam.  FHT:  Baseline 140 , moderate variability, accelerations present, no decelerations Contractions: occasional irritability   Labs: No results found for this or any previous visit (from the past 24 hour(s)). AB/Positive/-- (08/09 1613)  Imaging:  No results found.  MAU Course/MDM: NST reviewed, reactive and reassuring .  Treatments in MAU included EFM.    Assessment: Single IUP at [redacted]w[redacted]d Decreased fetal movement Reactive nonstress test   Plan: Discharge home Preterm Labor precautions and fetal kick counts Follow up in Office  for prenatal visits  Encouraged to return if she develops worsening of symptoms, increase in pain, fever, or other concerning symptoms.  Pt stable at time of discharge.  Wynelle Bourgeois CNM, MSN Certified Nurse-Midwife 01/12/2021 10:39 PM

## 2021-01-14 ENCOUNTER — Ambulatory Visit: Payer: Self-pay | Admitting: Internal Medicine

## 2021-01-17 ENCOUNTER — Encounter: Payer: Self-pay | Admitting: *Deleted

## 2021-01-17 ENCOUNTER — Other Ambulatory Visit: Payer: Self-pay

## 2021-01-17 ENCOUNTER — Ambulatory Visit (INDEPENDENT_AMBULATORY_CARE_PROVIDER_SITE_OTHER): Payer: No Typology Code available for payment source | Admitting: Family Medicine

## 2021-01-17 VITALS — BP 110/68 | HR 84 | Wt 179.1 lb

## 2021-01-17 DIAGNOSIS — O24419 Gestational diabetes mellitus in pregnancy, unspecified control: Secondary | ICD-10-CM | POA: Diagnosis not present

## 2021-01-17 DIAGNOSIS — Z3A33 33 weeks gestation of pregnancy: Secondary | ICD-10-CM | POA: Diagnosis not present

## 2021-01-17 DIAGNOSIS — Z3400 Encounter for supervision of normal first pregnancy, unspecified trimester: Secondary | ICD-10-CM | POA: Diagnosis not present

## 2021-01-17 LAB — GLUCOSE, CAPILLARY: Glucose-Capillary: 73 mg/dL (ref 70–99)

## 2021-01-17 NOTE — Progress Notes (Signed)
Patient follow up ultrasound scheduled for 01/26/21 at 2:30 PM. Patient notified.

## 2021-01-18 ENCOUNTER — Encounter: Payer: Self-pay | Admitting: Family Medicine

## 2021-01-18 NOTE — Progress Notes (Signed)
° ° °  Subjective:  Amber Alvarez is a 20 y.o. G1P0 at 25w4dbeing seen today for ongoing prenatal care.  She is currently monitored for the following issues for this high-risk pregnancy and has Supervision of normal first pregnancy, antepartum and Gestational diabetes on their problem list.  Patient reports she is doing well, having intermittent round ligament pain.  Contractions: Not present. Vag. Bleeding: None.  Movement: Present. Denies leaking of fluid.   She is aware she was recently diagnosed with GDM. She has not met with the diabetic coordinator yet and doesn't not have supplies for checking her sugars. She mainly craves sweet foods and has been eating a lot of fruit and sweets.   Her friend provided interpretation for the duration of the visit.   The following portions of the patient's history were reviewed and updated as appropriate: allergies, current medications, past family history, past medical history, past social history, past surgical history and problem list.   Objective:   Vitals:   01/17/21 1605  BP: 110/68  Pulse: 84  Weight: 179 lb 1.6 oz (81.2 kg)    Fetal Status: Fetal Heart Rate (bpm): 150   Movement: Present     General:  Alert, oriented and cooperative. Patient is in no acute distress.  Skin: Skin is warm and dry. No rash noted.   Cardiovascular: Normal heart rate noted  Respiratory: Normal respiratory effort, no problems with respiration noted  Abdomen: Soft, gravid, appropriate for gestational age. Pain/Pressure: Present     Pelvic:  Cervical exam deferred        Extremities: Normal range of motion.  Edema: None  Mental Status: Normal mood and affect. Normal behavior. Normal judgment and thought content.    Assessment and Plan:  Pregnancy: G1P0 at 360w4d1. Supervision of normal first pregnancy, antepartum Doing well with normal fetal movement.   2. [redacted] weeks gestation of pregnancy  3. Gestational diabetes mellitus (GDM), antepartum, gestational  diabetes method of control unspecified Diagnosed via 11/22 abnormal 2 hour GTT. She has missed calls from the diabetic coordinator, however may be have been complicated by language barrier and informed her they would be calling to get this scheduled asap. CBG 73 during visit, last ate 4 hours prior. Discussed balancing her diet, increasing vegetable intake.  Growth [place] for 12/28.  - Amb Referral to Nutrition and Diabetic Education  Preterm labor symptoms and general obstetric precautions including but not limited to vaginal bleeding, contractions, leaking of fluid and fetal movement were reviewed in detail with the patient. Please refer to After Visit Summary for other counseling recommendations.   Return in about 2 weeks (around 01/31/2021) for HROB.   BePatriciaann ClanDO

## 2021-01-20 ENCOUNTER — Other Ambulatory Visit: Payer: Self-pay

## 2021-01-20 ENCOUNTER — Encounter (HOSPITAL_COMMUNITY): Payer: Self-pay | Admitting: Obstetrics and Gynecology

## 2021-01-20 ENCOUNTER — Inpatient Hospital Stay (HOSPITAL_COMMUNITY)
Admission: AD | Admit: 2021-01-20 | Discharge: 2021-01-20 | Disposition: A | Discharge status: Home / Self Care | Payer: No Typology Code available for payment source | Attending: Obstetrics and Gynecology | Admitting: Obstetrics and Gynecology

## 2021-01-20 DIAGNOSIS — Z3A33 33 weeks gestation of pregnancy: Secondary | ICD-10-CM

## 2021-01-20 DIAGNOSIS — Z3689 Encounter for other specified antenatal screening: Secondary | ICD-10-CM

## 2021-01-20 DIAGNOSIS — O26893 Other specified pregnancy related conditions, third trimester: Secondary | ICD-10-CM | POA: Insufficient documentation

## 2021-01-20 DIAGNOSIS — R141 Gas pain: Secondary | ICD-10-CM | POA: Diagnosis not present

## 2021-01-20 LAB — URINALYSIS, ROUTINE W REFLEX MICROSCOPIC
Bilirubin Urine: NEGATIVE
Glucose, UA: NEGATIVE mg/dL
Hgb urine dipstick: NEGATIVE
Ketones, ur: NEGATIVE mg/dL
Leukocytes,Ua: NEGATIVE
Nitrite: NEGATIVE
Protein, ur: NEGATIVE mg/dL
Specific Gravity, Urine: 1.006 (ref 1.005–1.030)
pH: 7 (ref 5.0–8.0)

## 2021-01-20 LAB — WET PREP, GENITAL
Clue Cells Wet Prep HPF POC: NONE SEEN
Sperm: NONE SEEN
Trich, Wet Prep: NONE SEEN
WBC, Wet Prep HPF POC: <10 (ref <10)
Yeast Wet Prep HPF POC: NONE SEEN

## 2021-01-20 MED ORDER — LACTATED RINGERS IV BOLUS
1000.0000 mL | Freq: Once | INTRAVENOUS | Status: AC
  Administered 2021-01-20: 10:00:00 1000 mL via INTRAVENOUS

## 2021-01-20 MED ORDER — SIMETHICONE 80 MG PO CHEW
80.0000 mg | CHEWABLE_TABLET | Freq: Once | ORAL | Status: AC
  Administered 2021-01-20: 10:00:00 80 mg via ORAL
  Filled 2021-01-20: qty 1

## 2021-01-20 NOTE — MAU Note (Signed)
Amber Alvarez is a 20 y.o. at [redacted]w[redacted]d here in MAU reporting: umbilical pain since last night. Pain is really bad this morning. No bleeding or LOF. +FM  Onset of complaint: last night  Pain score: 7/10  Vitals:   01/20/21 0824  BP: 116/68  Pulse: 93  Resp: 18  Temp: 98.1 F (36.7 C)  SpO2: 98%     FHT: +FM  Lab orders placed from triage: UA

## 2021-01-20 NOTE — MAU Provider Note (Signed)
Event Date/Time   First Provider Initiated Contact with Patient 01/20/21 [phone]     S Ms. Amber Alvarez is a 20 y.o. G1P0 pregnant female who presents to MAU today with complaint of "twisting pain" near her belly button and also in her upper abdomen. Denies bleeding or loss of fluid, has not felt the baby move as much throughout the night. Feels the pain mostly constant but does get intermittently worse. Had a normal bowel movement yesterday, no recent IC. Denies nausea, vomiting, constipation or diarrhea, also denies any urinary symptoms.   Receives care at [facility], prenatal record reviewed. Recently diagnosed with A1GDM.   Arabic speaking, sister-in-law present during visit and acting as interpreter. Pt's husband is a MD at [employer], he was updated over the sister-in-law's phone.  Pertinent items noted in HPI and remainder of comprehensive ROS otherwise negative.    O BP 116/68 (BP Location: Right Arm)    Pulse 93    Temp 98.1 F (36.7 C) (Oral)    Resp 18    Ht 5\' 4"  (1.626 m)    Wt 182 lb 12.8 oz (82.9 kg)    LMP 05/22/2020 (Exact Date)    SpO2 98% Comment: room air   BMI 31.38 kg/m  Physical Exam Vitals and nursing note reviewed.  Constitutional:      General: She is not in acute distress.    Appearance: She is well-developed and normal weight. She is not ill-appearing.  HENT:     Head: Normocephalic.  Eyes:     Pupils: Pupils are equal, round, and reactive to light.  Cardiovascular:     Rate and Rhythm: Normal rate.  Pulmonary:     Effort: Pulmonary effort is normal.  Abdominal:     General: There is distension (mild in upper abdomen).     Palpations: Abdomen is soft.     Tenderness: There is abdominal tenderness (mild) in the epigastric area and periumbilical area.     Hernia: No hernia is present.  Genitourinary:    Vagina: Normal. No vaginal discharge.     Comments: Dilation: Closed Effacement (%): 50 Cervical Position: Posterior Station: Ballotable Presentation:  Vertex Exam by:: [email or web address], CNM   Skin:    General: Skin is warm and dry.     Capillary Refill: Capillary refill takes less than 2 seconds.  Neurological:     Mental Status: She is alert and oriented to person, place, and time.  Psychiatric:        Mood and Affect: Mood normal. Mood is not anxious.        Behavior: Behavior normal.   Fetal Tracing: reactive with palpable movement, pt noted increase in movement as soon as monitors were applied Baseline: 135 Variability: moderate Accelerations: 15x15 Decelerations: none Toco: initially mild q13min, eased to occasional UI after fluids  MDM & MAU Course: Mild contractions q32min so LR bolus given via IV, contractions stopped Simethicone 80mg  chewable given with complete relief of symptoms, pt verbalized readiness to go home.   A Gas pain NST reactive [redacted] weeks gestation  P Discharge from MAU in stable condition with return precautions Follow up at [facility] as scheduled for ongoing prenatal care  , CNM 01/20/2021 11:07 AM

## 2021-01-21 LAB — GC/CHLAMYDIA PROBE AMP (~~LOC~~) NOT AT ARMC
Chlamydia: NEGATIVE
Comment: NEGATIVE
Comment: NORMAL
Neisseria Gonorrhea: NEGATIVE

## 2021-01-25 ENCOUNTER — Ambulatory Visit: Payer: No Typology Code available for payment source | Admitting: Registered"

## 2021-01-25 ENCOUNTER — Other Ambulatory Visit: Payer: Self-pay

## 2021-01-25 ENCOUNTER — Encounter: Payer: No Typology Code available for payment source | Attending: Family Medicine | Admitting: Registered"

## 2021-01-25 DIAGNOSIS — Z3A00 Weeks of gestation of pregnancy not specified: Secondary | ICD-10-CM | POA: Insufficient documentation

## 2021-01-25 DIAGNOSIS — O24419 Gestational diabetes mellitus in pregnancy, unspecified control: Secondary | ICD-10-CM | POA: Diagnosis not present

## 2021-01-25 MED ORDER — FREESTYLE TEST VI STRP: ORAL_STRIP | 12 refills | Status: DC

## 2021-01-25 MED ORDER — FREESTYLE LITE W/DEVICE KIT: 1.0000 | PACK | Freq: Four times a day (QID) | 0 refills | Status: DC

## 2021-01-25 MED ORDER — FREESTYLE LANCETS MISC: 12 refills | Status: DC

## 2021-01-25 NOTE — Progress Notes (Signed)
Patient's husband provided interpretation Urdu (rare dialect) for appointment, they have signed waiver on file for interpreter.  Patient was seen for Gestational Diabetes self-management on 01/25/21  Start time 1540 and End time 1650   Estimated due date: 03/04/21; [redacted]w[redacted]d  Clinical: Medications: reviewed Medical History: reviewed Labs: OGTT 1 hr 187; 2 hr 166, A1c 5.4%   Dietary and Lifestyle History: Pt states was eating a lot of sweets 2nd trimester. Pt states she eats often, always hungry.  Pt reports vegetable intake 3x/week. Pt favorite food is hush puppies from [place] [employer] (1 serving is 93 g cho)  Physical Activity: not assessed Stress: not assessed Sleep: not assessed  24 hr Recall:  First Meal: 3-4 pieces bread, 1 egg tea with 1/2 milk & sugar Snack: fruit Second meal: chipate, tea w/milk & 1 tsp sugar  Snack: peanuts or walnuts Third meal: meat or lentils over 2 c rice Snack: Beverages: water, 2 glasses of milk per day, tea  NUTRITION INTERVENTION  Nutrition education (E-1) on the following topics:   Initial Follow-up  [x]  []  Definition of Gestational Diabetes [x]  []  Why dietary management is important in controlling blood glucose [x]  []  Effects each nutrient has on blood glucose levels []  []  Simple carbohydrates vs complex carbohydrates []  []  Fluid intake [x]  []  Creating a balanced meal plan [x]  []  Carbohydrate counting  [x]  []  When to check blood glucose levels [x]  []  Proper blood glucose monitoring techniques []  []  Effect of stress and stress reduction techniques  []  []  Exercise effect on blood glucose levels, appropriate exercise during pregnancy []  []  Importance of limiting caffeine and abstaining from alcohol and smoking []  []  Medications used for blood sugar control during pregnancy []  []  Hypoglycemia and rule of 15 []  []  Postpartum self care  Blood glucose monitor given: OneTouch Verio Flex Lot# x Exp: 03/01/2020 Blood Glucose: 104 mg/dL    Patient instructed to monitor glucose levels: FBS: 60 - ? 95 mg/dL (some clinics use 90 for cutoff) 1 hour: ? 140 mg/dL 2 hour: ? mg/dL  Patient received handouts: Nutrition Diabetes and Pregnancy Carbohydrate Counting List  Patient will be seen for follow-up as needed.

## 2021-01-26 ENCOUNTER — Ambulatory Visit: Payer: No Typology Code available for payment source | Attending: Obstetrics and Gynecology

## 2021-01-26 ENCOUNTER — Ambulatory Visit: Payer: No Typology Code available for payment source | Admitting: *Deleted

## 2021-01-26 VITALS — BP 125/80 | HR 92

## 2021-01-26 DIAGNOSIS — Z3A34 34 weeks gestation of pregnancy: Secondary | ICD-10-CM

## 2021-01-26 DIAGNOSIS — O24419 Gestational diabetes mellitus in pregnancy, unspecified control: Secondary | ICD-10-CM | POA: Insufficient documentation

## 2021-01-26 DIAGNOSIS — O24410 Gestational diabetes mellitus in pregnancy, diet controlled: Secondary | ICD-10-CM | POA: Diagnosis present

## 2021-01-29 ENCOUNTER — Encounter: Payer: Self-pay | Admitting: Obstetrics and Gynecology

## 2021-01-29 DIAGNOSIS — O3660X0 Maternal care for excessive fetal growth, unspecified trimester, not applicable or unspecified: Secondary | ICD-10-CM | POA: Insufficient documentation

## 2021-01-30 NOTE — L&D Delivery Note (Addendum)
OB/GYN Faculty Practice Delivery Note  Amber Alvarez is a 21 y.o. G1P1001 s/p SVD at [redacted]w[redacted]d. She was admitted for IOL for A1GDM.   ROM: 11h 71m with clear fluid GBS Status: negative Maximum Maternal Temperature: 100.3  Labor Progress: Presented for IOL, was closed and received cytotec x3 and a foley balloon. Eventually pitocin was started and her membranes were ruptured and she progressed to complete. She also developed chorioamnionitis (fetal tachycardia to 170s and maternal temp 100.3)when she was complete and was treated with Tylenol, Ampicillin and Gentamycin.   Delivery Date/Time: 1359 on 02/26/2021 Delivery: Called to room and patient was complete and pushing. Pushed with patient while crowning. Head delivered LOA. No nuchal cord present. Shoulder and body delivered in usual fashion. Infant with spontaneous cry, placed on mother's abdomen, dried and stimulated. Cord clamped x 2 after 1-minute delay, and cut by father of baby. Cord blood drawn. Placenta delivered spontaneously with gentle cord traction. Fundus firm with massage and Pitocin. Labia, perineum, vagina, and cervix inspected and found to have a deep second degree perineal laceration with a right labial extension. The perineal portion was repaired with a 3-0 vicryl and the superficial labial extension was repaired with 4-0 Monocryl.  Of note, due to continued steady bleeding at the laceration site during the repair patient was given a dose of TXA. She also had some continued trickling from uterus and was given a dose of Methergine.   Placenta: intact, 3V cord, to L&D Complications: Chorioamnionitis Lacerations: 2nd degree perineal with right labial extension EBL: 295cc Analgesia: epidual and local lidocaine   Infant: female   APGARs 8,8   weight pending  Warner Mccreedy, MD, MPH OB Fellow, Faculty Practice Center for [facility], [facility] 02/26/2021, 3:11 PM

## 2021-02-02 ENCOUNTER — Encounter: Payer: No Typology Code available for payment source | Admitting: Family Medicine

## 2021-02-03 ENCOUNTER — Other Ambulatory Visit: Payer: Self-pay

## 2021-02-03 ENCOUNTER — Ambulatory Visit (INDEPENDENT_AMBULATORY_CARE_PROVIDER_SITE_OTHER): Payer: No Typology Code available for payment source | Admitting: Family Medicine

## 2021-02-03 VITALS — BP 113/73 | HR 63 | Wt 182.8 lb

## 2021-02-03 DIAGNOSIS — O24419 Gestational diabetes mellitus in pregnancy, unspecified control: Secondary | ICD-10-CM

## 2021-02-03 DIAGNOSIS — Z603 Acculturation difficulty: Secondary | ICD-10-CM

## 2021-02-03 DIAGNOSIS — O3663X0 Maternal care for excessive fetal growth, third trimester, not applicable or unspecified: Secondary | ICD-10-CM

## 2021-02-03 DIAGNOSIS — Z3400 Encounter for supervision of normal first pregnancy, unspecified trimester: Secondary | ICD-10-CM

## 2021-02-03 DIAGNOSIS — Z789 Other specified health status: Secondary | ICD-10-CM

## 2021-02-03 HISTORY — DX: Acculturation difficulty: Z60.3

## 2021-02-03 NOTE — Patient Instructions (Signed)

## 2021-02-03 NOTE — Progress Notes (Signed)
° °  PRENATAL VISIT NOTE  Subjective:  Amber Alvarez is a 21 y.o. G1P0 at [redacted]w[redacted]d being seen today for ongoing prenatal care.  She is currently monitored for the following issues for this high-risk pregnancy and has Supervision of normal first pregnancy, antepartum; Gestational diabetes; Large for gestational age fetus affecting management of mother; and Language barrier on their problem list.  Patient reports no complaints.  Contractions: Not present. Vag. Bleeding: None.  Movement: Present. Denies leaking of fluid.   The following portions of the patient's history were reviewed and updated as appropriate: allergies, current medications, past family history, past medical history, past social history, past surgical history and problem list.   Objective:   Vitals:   02/03/21 1617  BP: 113/73  Pulse: 63  Weight: 182 lb 12.8 oz (82.9 kg)    Fetal Status: Fetal Heart Rate (bpm): 151 Fundal Height: 37 cm Movement: Present     General:  Alert, oriented and cooperative. Patient is in no acute distress.  Skin: Skin is warm and dry. No rash noted.   Cardiovascular: Normal heart rate noted  Respiratory: Normal respiratory effort, no problems with respiration noted  Abdomen: Soft, gravid, appropriate for gestational age.  Pain/Pressure: Absent     Pelvic: Cervical exam deferred        Extremities: Normal range of motion.  Edema: None  Mental Status: Normal mood and affect. Normal behavior. Normal judgment and thought content.   Assessment and Plan:  Pregnancy: G1P0 at [redacted]w[redacted]d 1. Gestational diabetes mellitus (GDM), antepartum, gestational diabetes method of control unspecified No log today FBS 90s 2 hour pp 109, 128 EFW is 3126 gm 69lb 14 oz 97% at 34 5/7 weeks Repeat at 38+ weeks. - [place] MFM OB FOLLOW UP; Future  2. Supervision of normal first pregnancy, antepartum GBS culture next week  3. Excessive fetal growth affecting management of pregnancy in third trimester, single or unspecified  fetus   4. Language barrier [place] interpreter: Husband used   Preterm labor symptoms and general obstetric precautions including but not limited to vaginal bleeding, contractions, leaking of fluid and fetal movement were reviewed in detail with the patient. Please refer to After Visit Summary for other counseling recommendations.   Return in 1 week (on 02/10/2021).  Future Appointments  Date Time Provider [place]  02/08/2021  2:15 PM [facility]  02/14/2021  3:15 PM Radene Gunning, MD [facility]  02/21/2021  2:45 PM [facility] US5 [facility]    Donnamae Jude, MD

## 2021-02-05 ENCOUNTER — Inpatient Hospital Stay (HOSPITAL_COMMUNITY)
Admission: AD | Admit: 2021-02-05 | Discharge: 2021-02-05 | Disposition: A | Discharge status: Home / Self Care | Payer: No Typology Code available for payment source | Attending: Obstetrics and Gynecology | Admitting: Obstetrics and Gynecology

## 2021-02-05 ENCOUNTER — Encounter (HOSPITAL_COMMUNITY): Payer: Self-pay | Admitting: Obstetrics and Gynecology

## 2021-02-05 ENCOUNTER — Other Ambulatory Visit: Payer: Self-pay

## 2021-02-05 ENCOUNTER — Inpatient Hospital Stay (HOSPITAL_COMMUNITY): Payer: No Typology Code available for payment source

## 2021-02-05 DIAGNOSIS — K8020 Calculus of gallbladder without cholecystitis without obstruction: Secondary | ICD-10-CM | POA: Diagnosis not present

## 2021-02-05 DIAGNOSIS — O26613 Liver and biliary tract disorders in pregnancy, third trimester: Secondary | ICD-10-CM | POA: Insufficient documentation

## 2021-02-05 DIAGNOSIS — O368130 Decreased fetal movements, third trimester, not applicable or unspecified: Secondary | ICD-10-CM | POA: Insufficient documentation

## 2021-02-05 DIAGNOSIS — O3663X0 Maternal care for excessive fetal growth, third trimester, not applicable or unspecified: Secondary | ICD-10-CM

## 2021-02-05 DIAGNOSIS — R1011 Right upper quadrant pain: Secondary | ICD-10-CM | POA: Diagnosis not present

## 2021-02-05 DIAGNOSIS — Z3A36 36 weeks gestation of pregnancy: Secondary | ICD-10-CM | POA: Insufficient documentation

## 2021-02-05 DIAGNOSIS — O26893 Other specified pregnancy related conditions, third trimester: Secondary | ICD-10-CM | POA: Diagnosis not present

## 2021-02-05 DIAGNOSIS — Z3689 Encounter for other specified antenatal screening: Secondary | ICD-10-CM | POA: Insufficient documentation

## 2021-02-05 HISTORY — DX: Gestational diabetes mellitus in pregnancy, unspecified control: O24.419

## 2021-02-05 LAB — URINALYSIS, ROUTINE W REFLEX MICROSCOPIC
Bilirubin Urine: NEGATIVE
Glucose, UA: NEGATIVE mg/dL
Ketones, ur: NEGATIVE mg/dL
Leukocytes,Ua: NEGATIVE
Nitrite: NEGATIVE
Protein, ur: NEGATIVE mg/dL
Specific Gravity, Urine: <1.005 — ABNORMAL LOW (ref 1.005–1.030)
pH: 6 (ref 5.0–8.0)

## 2021-02-05 LAB — URINALYSIS, MICROSCOPIC (REFLEX)

## 2021-02-05 MED ORDER — ONDANSETRON 4 MG PO TBDP: 4.0000 mg | ORAL_TABLET | Freq: Four times a day (QID) | ORAL | 0 refills | Status: DC | PRN

## 2021-02-05 NOTE — MAU Provider Note (Signed)
History     CSN: [id number]  Arrival date and time: 02/05/21 1929   Event Date/Time   First Provider Initiated Contact with Patient 02/05/21 2050      Chief Complaint  Patient presents with   Decreased Fetal Movement   Amber Alvarez is a 21 y.o. G1P0 at 34w1dwho [facility]  She presents today for Decreased Fetal Movement.  She endorses movement since arrival.  She states she did not try any She reports cramping in RUQ that was initially intermittent, but is now constant.  She reports attempting a hot compress, but with no relief of symptoms.  She rates the pain a 6-7/10. She reports also drinking water and "oral IV"  She denies perception of contractions and vaginal discharge, bleeding, or leaking.   Husband, GRosine Beat is at bedside and acting as interpreter.  He reports patient had an event, today, for the passing of her father.  He reports that patient did not eat as much as usual. He reports after the notification of her father's death patient sat "crunched up" for several hours despite coaxing to change positions.    OB History     Gravida  1   Para      Term      Preterm      AB      Living         SAB      IAB      Ectopic      Multiple      Live Births              Past Medical History:  Diagnosis Date   Gestational diabetes    A1GDM    Past Surgical History:  Procedure Laterality Date   NO PAST SURGERIES      No family history on file.  Social History   Tobacco Use   Smoking status: Never    Passive exposure: Never   Smokeless tobacco: Never  Vaping Use   Vaping Use: Never used  Substance Use Topics   Alcohol use: Never   Drug use: Never    Allergies: No Known Allergies  Medications Prior to Admission  Medication Sig Dispense Refill Last Dose   Prenatal Vit-Fe Fumarate-FA (PRENATAL VITAMIN PO) Take 1 tablet by mouth daily.   02/05/2021   Blood Glucose Monitoring Suppl (FREESTYLE LITE) w/Device KIT 1 Device by Does not  apply route in the morning, at noon, in the evening, and at bedtime. 1 kit 0    glucose blood (FREESTYLE TEST STRIPS) test strip Use as instructed 100 each 12    Lancets (FREESTYLE) lancets Use as instructed 100 each 12     Review of Systems  Gastrointestinal:  Positive for abdominal pain (RUQ-Cramping). Negative for nausea and vomiting.  Genitourinary:  Negative for vaginal bleeding and vaginal discharge.  Neurological:  Positive for light-headedness and headaches. Negative for dizziness.  Physical Exam   Blood pressure 116/73, pulse (!) 116, temperature 98.1 F (36.7 C), temperature source Oral, resp. rate 16, height '5\' 4"'  (1.626 m), weight 83.1 kg, last menstrual period 05/22/2020, SpO2 99 %, unknown if currently breastfeeding.  Physical Exam Vitals reviewed.  Constitutional:      Appearance: Normal appearance.  HENT:     Head: Normocephalic and atraumatic.  Eyes:     Conjunctiva/sclera: Conjunctivae normal.  Cardiovascular:     Rate and Rhythm: Normal rate and regular rhythm.     Pulses: Normal pulses.  Heart sounds: Normal heart sounds.  Pulmonary:     Effort: Pulmonary effort is normal. No respiratory distress.     Breath sounds: Normal breath sounds.  Abdominal:     General: Bowel sounds are normal.     Tenderness: There is abdominal tenderness in the right upper quadrant and epigastric area. There is no guarding or rebound.     Comments: Gravid, Appears LGA  Musculoskeletal:     Cervical back: Normal range of motion.  Skin:    General: Skin is warm and dry.  Neurological:     Mental Status: She is alert and oriented to person, place, and time.  Psychiatric:        Mood and Affect: Mood normal.        Thought Content: Thought content normal.    Fetal Assessment 150 bpm, Mod Var, -Decels, +Accels Toco: Q3-46mn  MAU Course   Results for orders placed or performed during the hospital encounter of 02/05/21 (from the past 24 hour(s))  Urinalysis, Routine w  reflex microscopic Urine, Clean Catch     Status: Abnormal   Collection Time: 02/05/21  8:05 PM  Result Value Ref Range   Color, Urine YELLOW YELLOW   APPearance CLEAR CLEAR   Specific Gravity, Urine <1.005 (L) 1.005 - 1.030   pH 6.0 5.0 - 8.0   Glucose, UA NEGATIVE NEGATIVE mg/dL   Hgb urine dipstick TRACE (A) NEGATIVE   Bilirubin Urine NEGATIVE NEGATIVE   Ketones, ur NEGATIVE NEGATIVE mg/dL   Protein, ur NEGATIVE NEGATIVE mg/dL   Nitrite NEGATIVE NEGATIVE   Leukocytes,Ua NEGATIVE NEGATIVE  Urinalysis, Microscopic (reflex)     Status: Abnormal   Collection Time: 02/05/21  8:05 PM  Result Value Ref Range   RBC / HPF 0-5 0 - 5 RBC/hpf   WBC, UA 0-5 0 - 5 WBC/hpf   Bacteria, UA RARE (A) NONE SEEN   Squamous Epithelial / LPF 0-5 0 - 5   UKoreaAbdomen Limited RUQ (LIVER/GB)  Result Date: 02/05/2021 CLINICAL DATA:  Right upper quadrant pain pregnant patient EXAM: ULTRASOUND ABDOMEN LIMITED RIGHT UPPER QUADRANT COMPARISON:  None. FINDINGS: Gallbladder: Sandlike stones in the gallbladder. Normal wall thickness. Negative sonographic Murphy. Common bile duct: Diameter: 2.9 mm Liver: No focal lesion identified. Within normal limits in parenchymal echogenicity. Portal vein is patent on color Doppler imaging with normal direction of blood flow towards the liver. Other: None. IMPRESSION: Cholelithiasis without sonographic evidence for acute cholecystitis. Electronically Signed   By: KDonavan FoilM.D.   On: 02/05/2021 21:46    MDM PE Labs: UA EFM Ultrasound  Assessment and Plan  20year old  G1P0  SIUP at 36.1 weeks Cat I FT RUQ Pain DFM  -POC reviewed. -Exam performed and findings discussed. -After exam, patient has improved with position change. -Discussed sending for abdominal ultrasound to assess for possible gallstones. -Informed that fetus will not be assessed during this ultrasound evaluation. -Reassured that fetal tracing is reactive. -Discussed possible labs if ultrasound warrants  further work-up. -Patient offered and declines pain medication.  JMaryann ConnersMSN, CNM 02/05/2021, 8:50 PM   Reassessment (10:33 PM) Cholelithiasis  -UKoreareturns as above. -Provided to bedside to discuss results. -Patient reports pain has resolved. -Reviewed findings and treatment including pain medication, antiemetic, and dietary changes as necessary. -Informational sheet to be provided in AVS. -Reassured that round ligament pain and anticipated complaint in pregnancy.  Techniques given for improved comfort. -Encouraged to call primary office or return to MAU if  symptoms worsen or with the onset of new symptoms. -Discharged to home in stable condition.  Maryann Conners MSN, CNM Advanced Practice Provider, Center for [employer]

## 2021-02-05 NOTE — MAU Note (Signed)
Amber Alvarez is a 21 y.o. at [redacted]w[redacted]d here in MAU reporting: DFM, not felt much movement after mid day today. Feels like the baby is "balled up" and having a cramp in her upper left abdomen.  LMP: 05/22/20 Onset of complaint: this afternoon Pain score: 4/10 Vitals:   02/05/21 1939  BP: 116/73  Pulse: (!) 116  Resp: 16  Temp: 98.1 F (36.7 C)  SpO2: 99%     FHT:156  Lab orders placed from triage: Urinalysis

## 2021-02-06 ENCOUNTER — Encounter: Payer: Self-pay | Admitting: Obstetrics and Gynecology

## 2021-02-06 DIAGNOSIS — Z8719 Personal history of other diseases of the digestive system: Secondary | ICD-10-CM | POA: Insufficient documentation

## 2021-02-06 DIAGNOSIS — K8020 Calculus of gallbladder without cholecystitis without obstruction: Secondary | ICD-10-CM | POA: Insufficient documentation

## 2021-02-06 HISTORY — DX: Personal history of other diseases of the digestive system: Z87.19

## 2021-02-08 ENCOUNTER — Other Ambulatory Visit: Payer: No Typology Code available for payment source

## 2021-02-13 ENCOUNTER — Inpatient Hospital Stay (HOSPITAL_COMMUNITY)
Admission: AD | Admit: 2021-02-13 | Discharge: 2021-02-13 | Disposition: A | Discharge status: Home / Self Care | Payer: No Typology Code available for payment source | Attending: Obstetrics & Gynecology | Admitting: Obstetrics & Gynecology

## 2021-02-13 ENCOUNTER — Other Ambulatory Visit: Payer: Self-pay

## 2021-02-13 ENCOUNTER — Encounter (HOSPITAL_COMMUNITY): Payer: Self-pay | Admitting: Obstetrics & Gynecology

## 2021-02-13 DIAGNOSIS — Z3A37 37 weeks gestation of pregnancy: Secondary | ICD-10-CM | POA: Insufficient documentation

## 2021-02-13 DIAGNOSIS — Z3689 Encounter for other specified antenatal screening: Secondary | ICD-10-CM | POA: Insufficient documentation

## 2021-02-13 DIAGNOSIS — O0990 Supervision of high risk pregnancy, unspecified, unspecified trimester: Secondary | ICD-10-CM

## 2021-02-13 DIAGNOSIS — O24410 Gestational diabetes mellitus in pregnancy, diet controlled: Secondary | ICD-10-CM | POA: Diagnosis not present

## 2021-02-13 DIAGNOSIS — O3660X0 Maternal care for excessive fetal growth, unspecified trimester, not applicable or unspecified: Secondary | ICD-10-CM | POA: Diagnosis present

## 2021-02-13 DIAGNOSIS — O368130 Decreased fetal movements, third trimester, not applicable or unspecified: Secondary | ICD-10-CM | POA: Insufficient documentation

## 2021-02-13 DIAGNOSIS — O24419 Gestational diabetes mellitus in pregnancy, unspecified control: Secondary | ICD-10-CM | POA: Diagnosis present

## 2021-02-13 LAB — OB RESULTS CONSOLE GBS: GBS: NEGATIVE

## 2021-02-13 LAB — OB RESULTS CONSOLE GC/CHLAMYDIA: Gonorrhea: NEGATIVE

## 2021-02-13 NOTE — MAU Provider Note (Signed)
History     CSN: [id number]  Arrival date and time: 02/13/21 2011   Event Date/Time   First Provider Initiated Contact with Patient 02/13/21 2048      Chief Complaint  Patient presents with   Decreased Fetal Movement   HPI  Patient Amber Alvarez is a 21 y.o. G1P0 at 24w2dhere with complaints of decreased fetal movements. She denies vaginal bleeding, contractions, LOF, HA, blurry vision, floating spots. She is GDMA1 diet controlled and Is only checking her sugars once or twice a day, but reports them in normal range.  Accompanied by her husband, who is a [facility] physician, and translates for her.  Of note, while in MAU, she started feeling more fetal movements.  OB History     Gravida  1   Para      Term      Preterm      AB      Living         SAB      IAB      Ectopic      Multiple      Live Births              Past Medical History:  Diagnosis Date   Gestational diabetes    A1GDM    Past Surgical History:  Procedure Laterality Date   NO PAST SURGERIES      History reviewed. No pertinent family history.  Social History   Tobacco Use   Smoking status: Never    Passive exposure: Never   Smokeless tobacco: Never  Vaping Use   Vaping Use: Never used  Substance Use Topics   Alcohol use: Never   Drug use: Never    Allergies: No Known Allergies  Medications Prior to Admission  Medication Sig Dispense Refill Last Dose   Blood Glucose Monitoring Suppl (FREESTYLE LITE) w/Device KIT 1 Device by Does not apply route in the morning, at noon, in the evening, and at bedtime. 1 kit 0    glucose blood (FREESTYLE TEST STRIPS) test strip Use as instructed 100 each 12    Lancets (FREESTYLE) lancets Use as instructed 100 each 12    ondansetron (ZOFRAN-ODT) 4 MG disintegrating tablet Take 1-2 tablets (4-8 mg total) by mouth every 6 (six) hours as needed for nausea or vomiting. 20 tablet 0    Prenatal Vit-Fe Fumarate-FA (PRENATAL VITAMIN PO) Take 1 tablet by  mouth daily.       Review of Systems  Constitutional: Negative.   HENT: Negative.    Respiratory: Negative.    Cardiovascular: Negative.   Gastrointestinal: Negative.   Genitourinary: Negative.   Musculoskeletal: Negative.   Neurological: Negative.   Psychiatric/Behavioral: Negative.    Physical Exam   Blood pressure 125/83, pulse 82, temperature 98.3 F (36.8 C), resp. rate 16, last menstrual period 05/22/2020, SpO2 98 %, unknown if currently breastfeeding.  Physical Exam Constitutional:      Appearance: Normal appearance.  Abdominal:     General: Abdomen is flat.  Musculoskeletal:        General: Normal range of motion.  Skin:    General: Skin is warm.  Neurological:     General: No focal deficit present.     Mental Status: She is alert.  Psychiatric:        Mood and Affect: Mood normal.    MAU Course  Procedures  MDM -patient feels strong movements while in MAU -NST reactive: 130s, + mod var, +accels no decels. -Toco  showed mild irregular contractions, not felt by patient -cervical exam declined, but pelvic cultures collected (GBS, GC/Chlam) -bedside ultrasound confirmed cephalic presentation  Assessment and Plan   1. Decreased fetal movements in third trimester, single or unspecified fetus   2. [redacted] weeks gestation of pregnancy   3. NST (non-stress test) reactive   4. Diet controlled gestational diabetes mellitus (GDM) in third trimester    Reassured by fetal movements here in MAU and reactive NST Patient's husband reported they are unable to make scheduled appointment on 02/14/21, no a prenatal visit was essentially done today.  Message sent to office to reschedule appointment. Blood sugars within range, scheduled for growth scan on 02/21/21. Discussed IOL with patient and her husband given concern about decreased fetal movement, GDM, LGA. Offered IOL at 39 weeks, they agreed to this plan. This was scheduled on 02/21/21 at midnight, orders signed and  held. Patient desires female providers, but is aware that half of the attendings are female and this cannot be guaranteed. She does not want students or learners in her labor room,  this was noted. GBS, Chlam/cultures done today, will follow up results and manage accordingly. Patient was given strict fetal movement and labor precautions, and discussed other reasons to return to MAU. Discharged to home in stable condition.   Future Appointments  Date Time Provider [place]  02/14/2021  3:15 PM Radene Gunning, MD [facility]  02/21/2021  2:45 PM [facility] US5 [facility]  02/25/2021 12:00 AM MC-LD SCHED ROOM MC-INDC None    Verita Schneiders, MD, [place] for [facility] 02/13/2021, 9:35 PM

## 2021-02-13 NOTE — Discharge Instructions (Signed)
Induction of labor scheduled for 02/25/21 at midnight, arrive at 11:45 pm on 02/24/21.  You will be called to reschedule for office appointment.

## 2021-02-13 NOTE — MAU Note (Signed)
Pt reports decreased fetal movement that started last night. Pt reports she last felt the baby move one hour ago.   Denies vaginal bleeding.   Denies LOF.

## 2021-02-14 ENCOUNTER — Encounter: Payer: No Typology Code available for payment source | Admitting: Obstetrics and Gynecology

## 2021-02-14 LAB — GC/CHLAMYDIA PROBE AMP (~~LOC~~) NOT AT ARMC
Chlamydia: NEGATIVE
Comment: NEGATIVE
Comment: NORMAL
Neisseria Gonorrhea: NEGATIVE

## 2021-02-15 LAB — CULTURE, BETA STREP (GROUP B ONLY)

## 2021-02-16 ENCOUNTER — Telehealth (HOSPITAL_COMMUNITY): Payer: Self-pay | Admitting: *Deleted

## 2021-02-16 ENCOUNTER — Other Ambulatory Visit: Payer: Self-pay | Admitting: Advanced Practice Midwife

## 2021-02-16 NOTE — Telephone Encounter (Signed)
Preadmission screen [phone] interpreter number

## 2021-02-17 ENCOUNTER — Telehealth (HOSPITAL_COMMUNITY): Payer: Self-pay | Admitting: *Deleted

## 2021-02-17 NOTE — Telephone Encounter (Signed)
Preadmission screen Interpreter number [phone]

## 2021-02-18 ENCOUNTER — Telehealth (HOSPITAL_COMMUNITY): Payer: Self-pay | Admitting: *Deleted

## 2021-02-18 NOTE — Telephone Encounter (Signed)
Preadmission screen Interpreter number [phone]

## 2021-02-21 ENCOUNTER — Encounter: Payer: Self-pay | Admitting: *Deleted

## 2021-02-21 ENCOUNTER — Ambulatory Visit: Payer: No Typology Code available for payment source | Admitting: *Deleted

## 2021-02-21 ENCOUNTER — Other Ambulatory Visit: Payer: Self-pay

## 2021-02-21 ENCOUNTER — Ambulatory Visit (INDEPENDENT_AMBULATORY_CARE_PROVIDER_SITE_OTHER): Payer: No Typology Code available for payment source | Admitting: Obstetrics & Gynecology

## 2021-02-21 ENCOUNTER — Encounter: Payer: Self-pay | Admitting: Obstetrics & Gynecology

## 2021-02-21 ENCOUNTER — Ambulatory Visit: Payer: No Typology Code available for payment source | Attending: Family Medicine

## 2021-02-21 ENCOUNTER — Telehealth (HOSPITAL_COMMUNITY): Payer: Self-pay | Admitting: *Deleted

## 2021-02-21 VITALS — BP 116/67 | HR 83

## 2021-02-21 VITALS — BP 118/75 | HR 81 | Wt 183.1 lb

## 2021-02-21 DIAGNOSIS — O3663X0 Maternal care for excessive fetal growth, third trimester, not applicable or unspecified: Secondary | ICD-10-CM

## 2021-02-21 DIAGNOSIS — Z3A38 38 weeks gestation of pregnancy: Secondary | ICD-10-CM

## 2021-02-21 DIAGNOSIS — O24419 Gestational diabetes mellitus in pregnancy, unspecified control: Secondary | ICD-10-CM | POA: Diagnosis present

## 2021-02-21 DIAGNOSIS — O368130 Decreased fetal movements, third trimester, not applicable or unspecified: Secondary | ICD-10-CM | POA: Diagnosis present

## 2021-02-21 DIAGNOSIS — O24410 Gestational diabetes mellitus in pregnancy, diet controlled: Secondary | ICD-10-CM

## 2021-02-21 DIAGNOSIS — O0993 Supervision of high risk pregnancy, unspecified, third trimester: Secondary | ICD-10-CM

## 2021-02-21 NOTE — Patient Instructions (Signed)
Return to office for any scheduled appointments. Call the office or go to the MAU at [facility] at  if:  You begin to have strong, frequent contractions  Your water breaks.  Sometimes it is a big gush of fluid, sometimes it is just a trickle that keeps getting your panties wet or running down your legs  You have vaginal bleeding.  It is normal to have a small amount of spotting if your cervix was checked.   You do not feel your baby moving like normal.  If you do not, get something to eat and drink and lay down and focus on feeling your baby move.   If your baby is still not moving like normal, you should call the office or go to MAU.  Any other obstetric concerns.   

## 2021-02-21 NOTE — Telephone Encounter (Signed)
Preadmission screen [phone] interpreter number

## 2021-02-21 NOTE — Progress Notes (Signed)
° °  PRENATAL VISIT NOTE  Subjective:  Amber Alvarez is a 21 y.o. G1P0 at [redacted]w[redacted]d being seen today for ongoing prenatal care.  Accompanied by her husband, who is a [facility] physician, and translates for her. She is currently monitored for the following issues for this high-risk pregnancy and has Supervision of high-risk pregnancy; Gestational diabetes; Large for gestational age fetus affecting management of mother; Language barrier; Cholelithiases; and Decreased fetal movement affecting management of pregnancy in third trimester, on their problem list.  Patient reports no complaints.  Contractions: Not present. Vag. Bleeding: None.  Movement: Present. Denies leaking of fluid.   The following portions of the patient's history were reviewed and updated as appropriate: allergies, current medications, past family history, past medical history, past social history, past surgical history and problem list.   Objective:   Vitals:   02/21/21 1349  BP: 118/75  Pulse: 81  Weight: 183 lb 1.6 oz (83.1 kg)    Fetal Status: Fetal Heart Rate (bpm): 152   Movement: Present     General:  Alert, oriented and cooperative. Patient is in no acute distress.  Skin: Skin is warm and dry. No rash noted.   Cardiovascular: Normal heart rate noted  Respiratory: Normal respiratory effort, no problems with respiration noted  Abdomen: Soft, gravid, appropriate for gestational age.  Pain/Pressure: Present     Pelvic: Cervical exam deferred        Extremities: Normal range of motion.  Edema: None  Mental Status: Normal mood and affect. Normal behavior. Normal judgment and thought content.   Results for orders placed or performed during the hospital encounter of 02/13/21 (from the past 336 hour(s))  GC/Chlamydia probe amp ([place])not at [facility]   Collection Time: 02/13/21  8:56 PM  Result Value Ref Range   Neisseria Gonorrhea Negative    Chlamydia Negative    Comment Normal Reference Ranger Chlamydia - Negative    Comment       Normal Reference Range Neisseria Gonorrhea - Negative  Culture, beta strep (group b only)   Collection Time: 02/13/21  9:03 PM   Specimen: Vaginal/Rectal; Genital  Result Value Ref Range   Specimen Description VAGINAL/RECTAL    Special Requests NONE    Culture      NO GROUP B STREP (S.AGALACTIAE) ISOLATED Performed at [facility], [address] [place], [place] [address]    Report Status 02/15/2021 FINAL     Assessment and Plan:  Pregnancy: G1P0 at [redacted]w[redacted]d 1. Diet controlled gestational diabetes mellitus (GDM) in third trimester 2. Excessive fetal growth affecting management of pregnancy in third trimester, single or unspecified fetus Reviewed blood sugars and they are within range. Some missing PP. Continue diet control. Growth scan today, will follow up results and manage accordingly. Already scheduled for IOL 02/25/21 at midnight.  3. [redacted] weeks gestation of pregnancy 4. Supervision of high risk pregnancy in third trimester Negative pelvic cultures reviewed with patient. Labor symptoms and general obstetric precautions including but not limited to vaginal bleeding, contractions, leaking of fluid and fetal movement were reviewed in detail with the patient. Please refer to After Visit Summary for other counseling recommendations.   Return for Postpartum check.  Future Appointments  Date Time Provider [place]  02/21/2021  2:30 PM [facility] NURSE [facility]  02/21/2021  2:45 PM [facility] US5 [facility]  02/25/2021 12:00 AM MC-LD SCHED ROOM MC-INDC None    Verita Schneiders, MD

## 2021-02-24 NOTE — H&P (Signed)
OBSTETRIC ADMISSION HISTORY AND PHYSICAL  Joseph Johns is a 21 y.o. female G1P0 with IUP at 68w6dby 111 weekUKoreapresenting for IOL due to A1GDM. She reports +FMs, no LOF, no VB, no blurry vision, headaches, peripheral edema, or RUQ pain.  She plans on breast feeding. She is undecided about what she would like to use for birth control postpartum.  [facility]  Dating: By UKorea--->  Estimated Date of Delivery: 03/04/21  Sono:   '@[redacted]w[redacted]d' , normal anatomy, cephalic presentation, anterior placental lie, 3927 g, 92% EFW  Prenatal History/Complications:  [id number] fetus (92% EFW on 1/23) Cholelithiasis Language barrier  Past Medical History: Past Medical History:  Diagnosis Date   Gestational diabetes    A1GDM    Past Surgical History: Past Surgical History:  Procedure Laterality Date   NO PAST SURGERIES      Obstetrical History: OB History     Gravida  1   Para      Term      Preterm      AB      Living         SAB      IAB      Ectopic      Multiple      Live Births              Social History Social History   Socioeconomic History   Marital status: Married    Spouse name: Not on file   Number of children: Not on file   Years of education: Not on file   Highest education level: High school graduate  Occupational History   Occupation: unemployed  Tobacco Use   Smoking status: Never    Passive exposure: Never   Smokeless tobacco: Never  Vaping Use   Vaping Use: Never used  Substance and Sexual Activity   Alcohol use: Never   Drug use: Never   Sexual activity: Yes    Birth control/protection: None  Other Topics Concern   Not on file  Social History Narrative   Not on file   Social Determinants of Health   Financial Resource Strain: Not on file  Food Insecurity: No Food Insecurity   Worried About R[place] the Last Year: Never true   [place] the Last Year: Never true  Transportation  Needs: No Transportation Needs   Lack of Transportation (Medical): No   Lack of Transportation (Non-Medical): No  Physical Activity: Not on file  Stress: Not on file  Social Connections: Not on file    Family History: No family history on file.  Allergies: No Known Allergies  Medications Prior to Admission  Medication Sig Dispense Refill Last Dose   Blood Glucose Monitoring Suppl (FREESTYLE LITE) w/Device KIT 1 Device by Does not apply route in the morning, at noon, in the evening, and at bedtime. 1 kit 0    famotidine (PEPCID) 10 MG tablet Take 10 mg by mouth 2 (two) times daily.      glucose blood (FREESTYLE TEST STRIPS) test strip Use as instructed 100 each 12    Lancets (FREESTYLE) lancets Use as instructed 100 each 12    Prenatal Vit-Fe Fumarate-FA (PRENATAL VITAMIN PO) Take 1 tablet by mouth daily.        Review of Systems  All systems reviewed and negative except as stated in HPI  Blood pressure 120/77, pulse 90, last menstrual period 05/22/2020, SpO2 98 %,  unknown if currently breastfeeding.  General appearance: alert, cooperative, and no distress Lungs: normal work of breathing on room air  Heart: normal rate, warm and well perfused  Abdomen: soft, non-tender, gravid  Extremities: no LE edema or calf tenderness to palpation   Presentation:  Cephalic Fetal monitoring: Baseline 145 bpm, moderate variability, + accels, no decels  Uterine activity: Intermittent contractions Dilation: 1 Effacement (%): Thick Station: -3 Exam by:: Vilma Meckel, MD   Prenatal labs: ABO, Rh: AB/Positive/-- (08/09 1613) Antibody: Negative (08/09 1613) Rubella: 13.90 (08/09 1613) RPR: Non Reactive (11/22 0837)  HBsAg: Negative (08/09 1613)  HIV: Non Reactive (11/22 0837)  GBS: Negative/-- (01/15 0000)  2 hr Glucola - Abnormal (84, 187, 166) Genetic screening - AFP negative  Anatomy US normal   Prenatal Transfer Tool  Maternal Diabetes: Yes:  Diabetes Type:  Diet  controlled Genetic Screening: Normal Maternal Ultrasounds/Referrals: Normal; LGA fetus  Fetal Ultrasounds or other Referrals:  None Maternal Substance Abuse:  No Significant Maternal Medications:  None Significant Maternal Lab Results: Group B Strep negative  No results found for this or any previous visit (from the past 24 hour(s)).  Patient Active Problem List   Diagnosis Date Noted   Gestational diabetes mellitus, class A1 02/25/2021   Decreased fetal movement affecting management of pregnancy in third trimester, 02/13/2021   Cholelithiases 02/06/2021   Language barrier 02/03/2021   Large for gestational age fetus affecting management of mother 01/29/2021   Gestational diabetes 12/28/2020   Supervision of high-risk pregnancy 08/23/2020    Assessment/Plan:  Chaniyah Jahr is a 22 y.o. G1P0 at 38w6dhere for IOL due to A1GDM.  #Labor: Will start induction with buccal Cytotec. Patient would like to avoid foley balloon for now. Will reassess at next check in 4 hours.  #Pain: PRN; planning  for epidural when ready  #FWB: Cat 1 #ID:  GBS neg #MOF: Breast  #MOC: Unsure  #Circ:  Desires   #A1GDM: Plan for q4 hour glucose checks in latent labor, q2 hour in active phase. SSI as needed.  Patient's partner at bedside able to translate in Urdu for patient. Declined formal interpreter due to specific dialect that she speaks.   CGenia Del MD  02/25/2021, 1:40 AM

## 2021-02-25 ENCOUNTER — Inpatient Hospital Stay (HOSPITAL_COMMUNITY)
Admission: AD | Admit: 2021-02-25 | Discharge: 2021-02-28 | DRG: 805 | Disposition: A | Discharge status: Home / Self Care | Payer: No Typology Code available for payment source | Attending: Obstetrics & Gynecology | Admitting: Obstetrics & Gynecology

## 2021-02-25 ENCOUNTER — Inpatient Hospital Stay (HOSPITAL_COMMUNITY): Payer: No Typology Code available for payment source | Admitting: Anesthesiology

## 2021-02-25 ENCOUNTER — Encounter (HOSPITAL_COMMUNITY): Payer: Self-pay | Admitting: Obstetrics and Gynecology

## 2021-02-25 ENCOUNTER — Other Ambulatory Visit: Payer: Self-pay

## 2021-02-25 ENCOUNTER — Inpatient Hospital Stay (HOSPITAL_COMMUNITY): Payer: No Typology Code available for payment source

## 2021-02-25 DIAGNOSIS — O24410 Gestational diabetes mellitus in pregnancy, diet controlled: Secondary | ICD-10-CM

## 2021-02-25 DIAGNOSIS — O24419 Gestational diabetes mellitus in pregnancy, unspecified control: Secondary | ICD-10-CM | POA: Diagnosis present

## 2021-02-25 DIAGNOSIS — O411230 Chorioamnionitis, third trimester, not applicable or unspecified: Secondary | ICD-10-CM | POA: Diagnosis present

## 2021-02-25 DIAGNOSIS — O3663X0 Maternal care for excessive fetal growth, third trimester, not applicable or unspecified: Secondary | ICD-10-CM

## 2021-02-25 DIAGNOSIS — Z20822 Contact with and (suspected) exposure to covid-19: Secondary | ICD-10-CM | POA: Diagnosis present

## 2021-02-25 DIAGNOSIS — Z3A38 38 weeks gestation of pregnancy: Secondary | ICD-10-CM | POA: Diagnosis not present

## 2021-02-25 DIAGNOSIS — O368130 Decreased fetal movements, third trimester, not applicable or unspecified: Secondary | ICD-10-CM

## 2021-02-25 DIAGNOSIS — K8020 Calculus of gallbladder without cholecystitis without obstruction: Secondary | ICD-10-CM | POA: Diagnosis present

## 2021-02-25 DIAGNOSIS — O24420 Gestational diabetes mellitus in childbirth, diet controlled: Principal | ICD-10-CM | POA: Diagnosis present

## 2021-02-25 DIAGNOSIS — Z789 Other specified health status: Secondary | ICD-10-CM | POA: Diagnosis present

## 2021-02-25 DIAGNOSIS — O2662 Liver and biliary tract disorders in childbirth: Secondary | ICD-10-CM | POA: Diagnosis not present

## 2021-02-25 DIAGNOSIS — O3660X0 Maternal care for excessive fetal growth, unspecified trimester, not applicable or unspecified: Secondary | ICD-10-CM | POA: Diagnosis present

## 2021-02-25 DIAGNOSIS — O0990 Supervision of high risk pregnancy, unspecified, unspecified trimester: Secondary | ICD-10-CM

## 2021-02-25 DIAGNOSIS — O9962 Diseases of the digestive system complicating childbirth: Secondary | ICD-10-CM | POA: Diagnosis present

## 2021-02-25 DIAGNOSIS — Z603 Acculturation difficulty: Secondary | ICD-10-CM | POA: Diagnosis present

## 2021-02-25 HISTORY — DX: Gestational diabetes mellitus in pregnancy, diet controlled: O24.410

## 2021-02-25 LAB — CBC
HCT: 38.4 % (ref 36.0–46.0)
Hemoglobin: 12.9 g/dL (ref 12.0–15.0)
MCH: 30.9 pg (ref 26.0–34.0)
MCHC: 33.6 g/dL (ref 30.0–36.0)
MCV: 92.1 fL (ref 80.0–100.0)
Platelets: 231 10*3/uL (ref 150–400)
RBC: 4.17 MIL/uL (ref 3.87–5.11)
RDW: 13.4 % (ref 11.5–15.5)
WBC: 9.8 10*3/uL (ref 4.0–10.5)
nRBC: 0 % (ref 0.0–0.2)

## 2021-02-25 LAB — RPR: RPR Ser Ql: NONREACTIVE

## 2021-02-25 LAB — COMPREHENSIVE METABOLIC PANEL
ALT: 15 U/L (ref 0–44)
AST: 27 U/L (ref 15–41)
Albumin: 2.9 g/dL — ABNORMAL LOW (ref 3.5–5.0)
Alkaline Phosphatase: 153 U/L — ABNORMAL HIGH (ref 38–126)
Anion gap: 8 (ref 5–15)
BUN: 7 mg/dL (ref 6–20)
CO2: 20 mmol/L — ABNORMAL LOW (ref 22–32)
Calcium: 8.8 mg/dL — ABNORMAL LOW (ref 8.9–10.3)
Chloride: 106 mmol/L (ref 98–111)
Creatinine, Ser: 0.47 mg/dL (ref 0.44–1.00)
GFR, Estimated: >60 mL/min (ref >60)
Glucose, Bld: 108 mg/dL — ABNORMAL HIGH (ref 70–99)
Potassium: 3.5 mmol/L (ref 3.5–5.1)
Sodium: 134 mmol/L — ABNORMAL LOW (ref 135–145)
Total Bilirubin: 0.7 mg/dL (ref 0.3–1.2)
Total Protein: 5.9 g/dL — ABNORMAL LOW (ref 6.5–8.1)

## 2021-02-25 LAB — TYPE AND SCREEN
ABO/RH(D): AB POS
Antibody Screen: NEGATIVE

## 2021-02-25 LAB — GLUCOSE, CAPILLARY
Glucose-Capillary: 105 mg/dL — ABNORMAL HIGH (ref 70–99)
Glucose-Capillary: 120 mg/dL — ABNORMAL HIGH (ref 70–99)
Glucose-Capillary: 91 mg/dL (ref 70–99)
Glucose-Capillary: 86 mg/dL (ref 70–99)
Glucose-Capillary: 92 mg/dL (ref 70–99)
Glucose-Capillary: 102 mg/dL — ABNORMAL HIGH (ref 70–99)

## 2021-02-25 LAB — RESP PANEL BY RT-PCR (FLU A&B, COVID) ARPGX2
Influenza A by PCR: NEGATIVE
Influenza B by PCR: NEGATIVE
SARS Coronavirus 2 by RT PCR: NEGATIVE

## 2021-02-25 MED ORDER — LIDOCAINE HCL (PF) 1 % IJ SOLN
INTRAMUSCULAR | Status: DC | PRN
  Administered 2021-02-25: 4 mL via EPIDURAL
  Administered 2021-02-25: 6 mL via EPIDURAL

## 2021-02-25 MED ORDER — OXYTOCIN BOLUS FROM INFUSION
333.0000 mL | Freq: Once | INTRAVENOUS | Status: AC
  Administered 2021-02-26: 333 mL via INTRAVENOUS

## 2021-02-25 MED ORDER — MISOPROSTOL 50MCG HALF TABLET
50.0000 ug | ORAL_TABLET | ORAL | Status: DC | PRN
  Administered 2021-02-25 (×3): 50 ug via BUCCAL
  Filled 2021-02-25 (×2): qty 1

## 2021-02-25 MED ORDER — FENTANYL-BUPIVACAINE-NACL 0.5-0.125-0.9 MG/250ML-% EP SOLN
12.0000 mL/h | EPIDURAL | Status: DC | PRN
  Administered 2021-02-25 – 2021-02-26 (×2): 12 mL/h via EPIDURAL
  Filled 2021-02-25 (×2): qty 250

## 2021-02-25 MED ORDER — LACTATED RINGERS IV SOLN
500.0000 mL | Freq: Once | INTRAVENOUS | Status: AC
  Administered 2021-02-25: 500 mL via INTRAVENOUS

## 2021-02-25 MED ORDER — LIDOCAINE HCL (PF) 1 % IJ SOLN
30.0000 mL | INTRAMUSCULAR | Status: AC | PRN
  Administered 2021-02-26: 30 mL via SUBCUTANEOUS
  Filled 2021-02-25: qty 30

## 2021-02-25 MED ORDER — PHENYLEPHRINE 40 MCG/ML (10ML) SYRINGE FOR IV PUSH (FOR BLOOD PRESSURE SUPPORT): 80.0000 ug | PREFILLED_SYRINGE | INTRAVENOUS | Status: DC | PRN

## 2021-02-25 MED ORDER — DIPHENHYDRAMINE HCL 50 MG/ML IJ SOLN: 12.5000 mg | INTRAMUSCULAR | Status: DC | PRN

## 2021-02-25 MED ORDER — EPHEDRINE 5 MG/ML INJ: 10.0000 mg | INTRAVENOUS | Status: DC | PRN

## 2021-02-25 MED ORDER — FENTANYL CITRATE (PF) 100 MCG/2ML IJ SOLN
50.0000 ug | INTRAMUSCULAR | Status: DC | PRN
  Administered 2021-02-25: 100 ug via INTRAVENOUS
  Administered 2021-02-25: 50 ug via INTRAVENOUS
  Administered 2021-02-25: 100 ug via INTRAVENOUS
  Filled 2021-02-25 (×3): qty 2

## 2021-02-25 MED ORDER — TERBUTALINE SULFATE 1 MG/ML IJ SOLN: 0.2500 mg | Freq: Once | INTRAMUSCULAR | Status: DC | PRN

## 2021-02-25 MED ORDER — MISOPROSTOL 50MCG HALF TABLET
ORAL_TABLET | ORAL | Status: AC
  Filled 2021-02-25: qty 1

## 2021-02-25 MED ORDER — OXYTOCIN-SODIUM CHLORIDE 30-0.9 UT/500ML-% IV SOLN
1.0000 m[IU]/min | INTRAVENOUS | Status: DC
  Administered 2021-02-25: 2 m[IU]/min via INTRAVENOUS
  Filled 2021-02-25: qty 500

## 2021-02-25 MED ORDER — HYDROXYZINE HCL 50 MG PO TABS: 50.0000 mg | ORAL_TABLET | Freq: Four times a day (QID) | ORAL | Status: DC | PRN

## 2021-02-25 MED ORDER — ACETAMINOPHEN 325 MG PO TABS: 650.0000 mg | ORAL_TABLET | ORAL | Status: DC | PRN

## 2021-02-25 MED ORDER — ONDANSETRON HCL 4 MG/2ML IJ SOLN: 4.0000 mg | Freq: Four times a day (QID) | INTRAMUSCULAR | Status: DC | PRN

## 2021-02-25 MED ORDER — OXYCODONE-ACETAMINOPHEN 5-325 MG PO TABS: 2.0000 | ORAL_TABLET | ORAL | Status: DC | PRN

## 2021-02-25 MED ORDER — LACTATED RINGERS IV SOLN
500.0000 mL | INTRAVENOUS | Status: DC | PRN
  Administered 2021-02-26: 500 mL via INTRAVENOUS

## 2021-02-25 MED ORDER — FLEET ENEMA 7-19 GM/118ML RE ENEM: 1.0000 | ENEMA | Freq: Every day | RECTAL | Status: DC | PRN

## 2021-02-25 MED ORDER — ZOLPIDEM TARTRATE 5 MG PO TABS: 5.0000 mg | ORAL_TABLET | Freq: Every evening | ORAL | Status: DC | PRN

## 2021-02-25 MED ORDER — OXYTOCIN-SODIUM CHLORIDE 30-0.9 UT/500ML-% IV SOLN
2.5000 [IU]/h | INTRAVENOUS | Status: DC
  Administered 2021-02-26: 2.5 [IU]/h via INTRAVENOUS
  Filled 2021-02-25: qty 500

## 2021-02-25 MED ORDER — MISOPROSTOL 25 MCG QUARTER TABLET: 25.0000 ug | ORAL_TABLET | ORAL | Status: DC | PRN

## 2021-02-25 MED ORDER — SOD CITRATE-CITRIC ACID 500-334 MG/5ML PO SOLN
30.0000 mL | ORAL | Status: DC | PRN
Start: 2021-02-25 — End: 2021-02-26

## 2021-02-25 MED ORDER — OXYCODONE-ACETAMINOPHEN 5-325 MG PO TABS: 1.0000 | ORAL_TABLET | ORAL | Status: DC | PRN

## 2021-02-25 MED ORDER — LACTATED RINGERS IV SOLN: INTRAVENOUS | Status: DC

## 2021-02-25 NOTE — Progress Notes (Signed)
Labor Progress Note Amber Alvarez is a 21 y.o. G1P0 at [redacted]w[redacted]d who presented for IOL due to A1GDM.   S: Doing well. No concerns.   O:  BP 122/87    Pulse 87    Temp 98 F (36.7 C) (Oral)    Resp 16    Ht 5\' 4"  (1.626 m)    Wt 83.9 kg    LMP 05/22/2020 (Exact Date)    SpO2 98%    BMI 31.76 kg/m   EFM: Baseline 135 bpm, moderate variability, + accels, no decels  Toco: Every 4-6 minutes   CVE: Dilation: 1.5 Effacement (%): 30 Cervical Position: Anterior Station: -3 Presentation: Vertex Exam by:: [email or web address], RN   A&P: 21 y.o. G1P0 [redacted]w[redacted]d   #Labor: Progressing. Additional dose of buccal Cytotec given. Will reassess in 4 hours.  #Pain: PRN #FWB: Cat 1  #GBS negative  #A1GDM: CBGs 105, 120 since admit. Will continue q4 hour glucose checks.   [redacted]w[redacted]d, MD 6:37 AM

## 2021-02-25 NOTE — Progress Notes (Signed)
Amber Alvarez is a 21 y.o. G1P0 at [redacted]w[redacted]d admitted for induction of labor due to A1GDM.  Subjective: Reports feeling ok. Feeling some contractions but bearable.   Objective: BP 122/87    Pulse 87    Temp 98 F (36.7 C) (Oral)    Resp 16    Ht 5\' 4"  (1.626 m)    Wt 83.9 kg    LMP 05/22/2020 (Exact Date)    SpO2 98%    BMI 31.76 kg/m  I/O last 3 completed shifts: In: 688.3 [I.V.:688.3] Out: -  No intake/output data recorded.  FHT:  FHR: 140 bpm, variability: moderate,  accelerations:  Present,  decelerations:  Absent UC:   regular, every 1-3 minutes SVE:   Dilation: 1.5 Effacement (%): thick Station: -2 to -3 Exam by:: [email or web address], MD  Labs: Lab Results  Component Value Date   WBC 9.8 02/25/2021   HGB 12.9 02/25/2021   HCT 38.4 02/25/2021   MCV 92.1 02/25/2021   PLT 231 02/25/2021    Assessment / Plan: G1P0 at [redacted]w[redacted]d admitted for induction of labor due to A1GDM.  Labor:  patient now s/p cytotec x2. Cervix 1-1.5/thick/-2 to -3. Discussed in detail with patient regarding questions about foley balloon. Answered questions about pain management with placement. Offered option of placing balloon now with IV pain medication and doing another dose of Cytotec. Patient contemplated and discussed with partner and upon shared decision making with team opting for just cytotec now and if unchanged at next check or still needing effacement will consider balloon at next check.   Fetal Wellbeing:  Category I Pain Control:   planning epidural I/D:   GBS neg   #A1GDM BG here 108-120-91. Will continue to monitor q4hr.   #Suspected macrosomia EFW 92%ile. Will plan accordingly for delivery   [redacted]w[redacted]d, MD, MPH OB Fellow, Faculty Practice

## 2021-02-25 NOTE — Progress Notes (Signed)
Amber Alvarez is a 21 y.o. G1P0 at [redacted]w[redacted]d admitted for induction of labor due to A1GDM.  Subjective: Feels contractions and cramping getting stronger  Objective: BP 130/90    Pulse 91    Temp 98.3 F (36.8 C) (Oral)    Resp 16    Ht 5\' 4"  (1.626 m)    Wt 83.9 kg    LMP 05/22/2020 (Exact Date)    SpO2 98%    BMI 31.76 kg/m  I/O last 3 completed shifts: In: 688.3 [I.V.:688.3] Out: -  No intake/output data recorded.  FHT:  FHR: 140 bpm, variability: moderate,  accelerations:  Present,  decelerations:  Absent UC:   regular, every 1-2 minutes SVE:   Dilation: 4 Effacement (%): 50 Station: -2, -3 Exam by:: Cy Blamer, MD  Labs: Lab Results  Component Value Date   WBC 9.8 02/25/2021   HGB 12.9 02/25/2021   HCT 38.4 02/25/2021   MCV 92.1 02/25/2021   PLT 231 02/25/2021    Assessment / Plan: G1P0 at [redacted]w[redacted]d admitted for induction of labor due to A1GDM.  Labor:  now s/p foley balloon and cervix 4 cm. 50% effaced and head continues to be at -2 to -3 station. Will start pitocin. Discussed position changes as well as peanut ball in order to help with descent of head in pelvis. At next check assess for AROM  Fetal Wellbeing:  Category I Pain Control:  IV pain meds and plans epidural I/D:   GBS neg    #A1GDM BG 80s-90s. Within goal.  Renard Matter 02/25/2021, 5:38 PM

## 2021-02-25 NOTE — Anesthesia Preprocedure Evaluation (Signed)
Anesthesia Evaluation  Patient identified by MRN, date of birth, ID band Patient awake    Reviewed: Allergy & Precautions, H&P , NPO status , Patient's Chart, lab work & pertinent test results  History of Anesthesia Complications Negative for: history of anesthetic complications  Airway Mallampati: II  TM Distance: >3 FB     Dental   Pulmonary neg pulmonary ROS,    Pulmonary exam normal        Cardiovascular negative cardio ROS   Rhythm:regular Rate:Normal     Neuro/Psych negative neurological ROS  negative psych ROS   GI/Hepatic negative GI ROS, Neg liver ROS,   Endo/Other  diabetes, Gestational  Renal/GU negative Renal ROS  negative genitourinary   Musculoskeletal   Abdominal   Peds  Hematology negative hematology ROS (+)   Anesthesia Other Findings   Reproductive/Obstetrics (+) Pregnancy                             Anesthesia Physical Anesthesia Plan  ASA: 2  Anesthesia Plan: Epidural   Post-op Pain Management:    Induction:   PONV Risk Score and Plan:   Airway Management Planned:   Additional Equipment:   Intra-op Plan:   Post-operative Plan:   Informed Consent: I have reviewed the patients History and Physical, chart, labs and discussed the procedure including the risks, benefits and alternatives for the proposed anesthesia with the patient or authorized representative who has indicated his/her understanding and acceptance.       Plan Discussed with:   Anesthesia Plan Comments:         Anesthesia Quick Evaluation  

## 2021-02-25 NOTE — Progress Notes (Signed)
Amber Alvarez is a 21 y.o. G1P0 at [redacted]w[redacted]d admitted for induction of labor due to A1GDM.  Subjective: Feeling some cramping. Otherwise feels well  Objective: BP 130/90    Pulse 91    Temp 98.3 F (36.8 C) (Oral)    Resp 16    Ht 5\' 4"  (1.626 m)    Wt 83.9 kg    LMP 05/22/2020 (Exact Date)    SpO2 98%    BMI 31.76 kg/m  I/O last 3 completed shifts: In: 688.3 [I.V.:688.3] Out: -  No intake/output data recorded.  FHT:  FHR: 150 bpm, variability: moderate,  accelerations:  Present,  decelerations:  Absent UC:   irregular, every 3-5 minutes SVE:   Dilation: 1-1.5 Effacement (%): thick Station:-3 Exam by:: [email or web address], MD  Labs: Lab Results  Component Value Date   WBC 9.8 02/25/2021   HGB 12.9 02/25/2021   HCT 38.4 02/25/2021   MCV 92.1 02/25/2021   PLT 231 02/25/2021    Assessment / Plan: G1P0 at [redacted]w[redacted]d admitted for induction of labor due to A1GDM.  Labor:  cervix unchanged from last check and continues to need further effacement. We discussed foley balloon in detail and further answered patient's questions. Upon further discussion patient would like to move forward with balloon placement.   Fetal Wellbeing:  Category I Pain Control:  IV pain meds I/D:   GBS neg  [redacted]w[redacted]d, MD, MPH OB Fellow, Faculty Practice

## 2021-02-25 NOTE — Anesthesia Procedure Notes (Signed)
Epidural Patient location during procedure: OB Start time: 02/25/2021 7:30 PM End time: 02/25/2021 7:47 PM  Staffing Anesthesiologist: Lucretia Kern, MD Performed: anesthesiologist   Preanesthetic Checklist Completed: patient identified, IV checked, risks and benefits discussed, monitors and equipment checked, pre-op evaluation and timeout performed  Epidural Patient position: sitting Prep: DuraPrep Patient monitoring: heart rate, continuous pulse ox and blood pressure Approach: midline Location: L3-L4 Injection technique: LOR air  Needle:  Needle type: Tuohy  Needle gauge: 17 G Needle length: 9 cm Needle insertion depth: 6 cm Catheter type: closed end flexible Catheter size: 19 Gauge Catheter at skin depth: 11 cm Test dose: negative  Assessment Events: blood not aspirated, injection not painful, no injection resistance, no paresthesia and negative IV test  Additional Notes Reason for block:procedure for pain

## 2021-02-25 NOTE — Progress Notes (Addendum)
Labor Progress Note Amber Alvarez is a 21 y.o. G1P0 at [redacted]w[redacted]d who presented for IOL due to A1GDM.   S: Doing well, resting. Comfortable with epidural. No concerns at this time.  O:  BP (!) 109/54    Pulse 73    Temp 98 F (36.7 C) (Oral)    Resp 16    Ht 5\' 4"  (1.626 m)    Wt 83.9 kg    LMP 05/22/2020 (Exact Date)    SpO2 98%    BMI 31.76 kg/m   EFM: Baseline 145 bpm, moderate variability, + accels, no decels  Toco: Every 2-4 minutes   CVE: Dilation: 5 Effacement (%): 60 Cervical Position: Anterior Station: -2, -3 Presentation: Vertex Exam by:: [email or web address], RN  A&P: 21 y.o. G1P0 [redacted]w[redacted]d   #Labor: Progressing well. Will continue Pitocin and consider AROM on neck check.  #Pain: Epidural  #FWB: Cat 1  #GBS negative  #A1GDM: CBGs remain in normal rage. Continue q4hr glucose checks.   [redacted]w[redacted]d, MD 11:46 PM

## 2021-02-26 ENCOUNTER — Encounter (HOSPITAL_COMMUNITY): Payer: Self-pay | Admitting: Obstetrics and Gynecology

## 2021-02-26 DIAGNOSIS — Z3A38 38 weeks gestation of pregnancy: Secondary | ICD-10-CM

## 2021-02-26 DIAGNOSIS — O24420 Gestational diabetes mellitus in childbirth, diet controlled: Secondary | ICD-10-CM

## 2021-02-26 DIAGNOSIS — O3663X0 Maternal care for excessive fetal growth, third trimester, not applicable or unspecified: Secondary | ICD-10-CM

## 2021-02-26 DIAGNOSIS — O2662 Liver and biliary tract disorders in childbirth: Secondary | ICD-10-CM

## 2021-02-26 DIAGNOSIS — O411230 Chorioamnionitis, third trimester, not applicable or unspecified: Secondary | ICD-10-CM

## 2021-02-26 LAB — GLUCOSE, CAPILLARY
Glucose-Capillary: 116 mg/dL — ABNORMAL HIGH (ref 70–99)
Glucose-Capillary: 88 mg/dL (ref 70–99)
Glucose-Capillary: 107 mg/dL — ABNORMAL HIGH (ref 70–99)

## 2021-02-26 MED ORDER — MEDROXYPROGESTERONE ACETATE 150 MG/ML IM SUSP: 150.0000 mg | INTRAMUSCULAR | Status: DC | PRN

## 2021-02-26 MED ORDER — SENNOSIDES-DOCUSATE SODIUM 8.6-50 MG PO TABS
2.0000 | ORAL_TABLET | Freq: Every day | ORAL | Status: DC
  Administered 2021-02-27: 2 via ORAL
  Filled 2021-02-26 (×2): qty 2

## 2021-02-26 MED ORDER — ONDANSETRON HCL 4 MG/2ML IJ SOLN: 4.0000 mg | INTRAMUSCULAR | Status: DC | PRN

## 2021-02-26 MED ORDER — DIBUCAINE (PERIANAL) 1 % EX OINT: 1.0000 "application " | TOPICAL_OINTMENT | CUTANEOUS | Status: DC | PRN

## 2021-02-26 MED ORDER — TETANUS-DIPHTH-ACELL PERTUSSIS 5-2.5-18.5 LF-MCG/0.5 IM SUSY: 0.5000 mL | PREFILLED_SYRINGE | Freq: Once | INTRAMUSCULAR | Status: DC

## 2021-02-26 MED ORDER — ACETAMINOPHEN 500 MG PO TABS
1000.0000 mg | ORAL_TABLET | Freq: Once | ORAL | Status: AC
  Administered 2021-02-26: 1000 mg via ORAL
  Filled 2021-02-26: qty 2

## 2021-02-26 MED ORDER — COCONUT OIL OIL: 1.0000 "application " | TOPICAL_OIL | Status: DC | PRN

## 2021-02-26 MED ORDER — SIMETHICONE 80 MG PO CHEW: 80.0000 mg | CHEWABLE_TABLET | ORAL | Status: DC | PRN

## 2021-02-26 MED ORDER — GENTAMICIN SULFATE 40 MG/ML IJ SOLN
5.0000 mg/kg | INTRAVENOUS | Status: DC
  Administered 2021-02-26: 330 mg via INTRAVENOUS
  Filled 2021-02-26 (×2): qty 8.25

## 2021-02-26 MED ORDER — TRANEXAMIC ACID-NACL 1000-0.7 MG/100ML-% IV SOLN
INTRAVENOUS | Status: AC
  Filled 2021-02-26: qty 100

## 2021-02-26 MED ORDER — TRANEXAMIC ACID-NACL 1000-0.7 MG/100ML-% IV SOLN
1000.0000 mg | INTRAVENOUS | Status: AC
  Administered 2021-02-26: 1000 mg via INTRAVENOUS

## 2021-02-26 MED ORDER — IBUPROFEN 600 MG PO TABS
600.0000 mg | ORAL_TABLET | Freq: Four times a day (QID) | ORAL | Status: DC
  Administered 2021-02-26 – 2021-02-28 (×8): 600 mg via ORAL
  Filled 2021-02-26 (×8): qty 1

## 2021-02-26 MED ORDER — METHYLERGONOVINE MALEATE 0.2 MG/ML IJ SOLN
INTRAMUSCULAR | Status: AC
  Filled 2021-02-26: qty 1

## 2021-02-26 MED ORDER — WITCH HAZEL-GLYCERIN EX PADS: 1.0000 "application " | MEDICATED_PAD | CUTANEOUS | Status: DC | PRN

## 2021-02-26 MED ORDER — SODIUM CHLORIDE 0.9 % IV SOLN
2.0000 g | Freq: Four times a day (QID) | INTRAVENOUS | Status: DC
  Administered 2021-02-26: 2 g via INTRAVENOUS
  Filled 2021-02-26: qty 2000

## 2021-02-26 MED ORDER — BENZOCAINE-MENTHOL 20-0.5 % EX AERO
1.0000 "application " | INHALATION_SPRAY | CUTANEOUS | Status: DC | PRN
  Administered 2021-02-26: 1 via TOPICAL
  Filled 2021-02-26: qty 56

## 2021-02-26 MED ORDER — SODIUM CHLORIDE 0.9 % IV SOLN
2.0000 g | Freq: Four times a day (QID) | INTRAVENOUS | Status: AC
  Administered 2021-02-26 – 2021-02-27 (×3): 2 g via INTRAVENOUS
  Filled 2021-02-26 (×3): qty 2000

## 2021-02-26 MED ORDER — ONDANSETRON HCL 4 MG PO TABS: 4.0000 mg | ORAL_TABLET | ORAL | Status: DC | PRN

## 2021-02-26 MED ORDER — DIPHENHYDRAMINE HCL 25 MG PO CAPS: 25.0000 mg | ORAL_CAPSULE | Freq: Four times a day (QID) | ORAL | Status: DC | PRN

## 2021-02-26 MED ORDER — PRENATAL MULTIVITAMIN CH
1.0000 | ORAL_TABLET | Freq: Every day | ORAL | Status: DC
  Administered 2021-02-27 – 2021-02-28 (×2): 1 via ORAL
  Filled 2021-02-26 (×2): qty 1

## 2021-02-26 MED ORDER — METHYLERGONOVINE MALEATE 0.2 MG/ML IJ SOLN
0.2000 mg | Freq: Once | INTRAMUSCULAR | Status: AC
  Administered 2021-02-26: 0.2 mg via INTRAMUSCULAR

## 2021-02-26 MED ORDER — ACETAMINOPHEN 325 MG PO TABS
650.0000 mg | ORAL_TABLET | ORAL | Status: DC | PRN
  Administered 2021-02-27: 650 mg via ORAL
  Filled 2021-02-26: qty 2

## 2021-02-26 NOTE — Lactation Note (Signed)
This note was copied from a baby's chart. Lactation Consultation Note  Patient Name: Amber Alvarez HRCBU'L Date: 02/26/2021   Age:22 hours Per RN Clydie Braun),  in L&D mom prefers to be seen on MBU and not in L&D.  Maternal Data    Feeding    LATCH Score                    Lactation Tools Discussed/Used    Interventions    Discharge    Consult Status      Danelle Earthly 02/26/2021, 3:26 PM

## 2021-02-26 NOTE — Plan of Care (Signed)
Problem: Elimination: Goal: Will not experience complications related to urinary retention Outcome: Completed/Met   

## 2021-02-26 NOTE — Discharge Summary (Addendum)
Postpartum Discharge Summary     Patient Name: Amber Alvarez DOB: 04/09/2000 MRN: [id number]  Date of admission: 02/25/2021 Delivery date:02/26/2021  Delivering provider: Renard Matter  Date of discharge: 02/28/2021  Admitting diagnosis: Gestational diabetes mellitus, class A1 [O24.410] Intrauterine pregnancy: [redacted]w[redacted]d    Secondary diagnosis:  Principal Problem:   Gestational diabetes mellitus, class A1 Active Problems:   Supervision of high-risk pregnancy   Gestational diabetes   Large for gestational age fetus affecting management of mother   Language barrier   Vaginal delivery  Additional problems: Intrauterine Inflammation or Infection   Discharge diagnosis: Term Pregnancy Delivered and GDM A1                                              Post partum procedures: none Augmentation: AROM, Pitocin, Cytotec, and IP Foley Complications: Intrauterine Inflammation or infection (Chorioamniotis) treated with ampicillin and gentamicin   Hospital course: Induction of Labor With Vaginal Delivery   21y.o. yo G1P1001 at 373w1das admitted to the hospital 02/25/2021 for induction of labor.  Indication for induction: A1 DM.  Patient had an  labor course as follows: Membrane Rupture Time/Date: 2:50 AM ,02/26/2021   Delivery Method:Vaginal, Spontaneous  Episiotomy: None  Lacerations:  2nd degree;Perineal  She did develop chorioamnionitis in labor and was treated with antibiotics in labor as well as for 24 hour after. Details of delivery can be found in separate delivery note.  Patient had a routine postpartum course. Patient is discharged home 02/28/21.  Newborn Data: Birth date:02/26/2021  Birth time:1:59 PM  Gender:Female  Living status:Living  Apgars:8 ,8  Weight:9 lb 2.6 oz (4.155 kg)   Magnesium Sulfate received: No BMZ received: No Rhophylac:N/A MMR:N/A T-DaP:Given prenatally Flu: Given prenatally Transfusion:No  Physical exam  Vitals:   02/27/21 0618 02/27/21 1300 02/27/21 2100  02/28/21 0435  BP: 112/75 118/72 115/80 104/71  Pulse: 86 88 91 87  Resp: _0 Temp: (!) 97.4 F (36.3 C) 98 F (36.7 C) 98.6 F (37 C) 97.8 F (36.6 C)  TempSrc: Oral  Oral Oral  SpO2:   98% 100%  Weight:      Height:       General: alert, cooperative, and no distress Lochia: appropriate Uterine Fundus: firm Incision: N/A DVT Evaluation: No cords or calf tenderness. No significant calf/ankle edema. Labs: Lab Results  Component Value Date   WBC 9.8 02/25/2021   HGB 12.9 02/25/2021   HCT 38.4 02/25/2021   MCV 92.1 02/25/2021   PLT 231 02/25/2021   CMP Latest Ref Rng & Units 02/25/2021  Glucose 70 - 99 mg/dL 108(H)  BUN 6 - 20 mg/dL 7  Creatinine 0.44 - 1.00 mg/dL 0.47  Sodium 135 - 145 mmol/L 134(L)  Potassium 3.5 - 5.1 mmol/L 3.5  Chloride 98 - 111 mmol/L 106  CO2 22 - 32 mmol/L 20(L)  Calcium 8.9 - 10.3 mg/dL 8.8(L)  Total Protein 6.5 - 8.1 g/dL 5.9(L)  Total Bilirubin 0.3 - 1.2 mg/dL 0.7  Alkaline Phos 38 - 126 U/L 153(H)  AST 15 - 41 U/L 27  ALT 0 - 44 U/L 15   Edinburgh Score: Edinburgh Postnatal Depression Scale Screening Tool 02/27/2021  I have been able to laugh and see the funny side of things. 1  I have looked forward with enjoyment to things. 0  I  have blamed myself unnecessarily when things went wrong. 0  I have been anxious or worried for no good reason. 1  I have felt scared or panicky for no good reason. 1  Things have been getting on top of me. 0  I have been so unhappy that I have had difficulty sleeping. 1  I have felt sad or miserable. 1  I have been so unhappy that I have been crying. 1  The thought of harming myself has occurred to me. 0  Edinburgh Postnatal Depression Scale Total 6     After visit meds:  Allergies as of 02/28/2021       Reactions   Beef-derived Products    Not an allergy, halal dietary restriction   Chicken Protein    Pt cannot eat meats that are not Halal certified. This is not an allergy.    Fish-derived  Products    Pt cannot eat meats and seafood that are not Halal certified. This is not an allergy.    Pork-derived Products    Not an allergy, halal dietary restriction        Medication List     STOP taking these medications    famotidine 10 MG tablet Commonly known as: PEPCID   freestyle lancets   FreeStyle Lite w/Device Kit   FREESTYLE TEST STRIPS test strip Generic drug: glucose blood       TAKE these medications    acetaminophen 325 MG tablet Commonly known as: Tylenol Take 2 tablets (650 mg total) by mouth every 4 (four) hours as needed (for pain scale < 4).   ibuprofen 600 MG tablet Commonly known as: ADVIL Take 1 tablet (600 mg total) by mouth every 6 (six) hours.   PRENATAL VITAMIN PO Take 1 tablet by mouth daily.         Discharge home in stable condition Infant Feeding: Bottle and Breast Infant Disposition:home with mother Discharge instruction: per After Visit Summary and Postpartum booklet. Activity: Advance as tolerated. Pelvic rest for 6 weeks.  Diet: routine diet Future Appointments: Future Appointments  Date Time Provider [place]  03/30/2021  8:20 AM [facility]  03/30/2021  8:55 AM Anyanwu, Sallyanne Havers, MD [facility]   Follow up Visit:  [place] [facility]. Go on 03/29/2021.   Specialty: Obstetrics and Gynecology Contact information: [place] [id number] [phone]               Message sent to [facility], [facility], [facility] by Dr. Cy Blamer on 02/26/2021  Appointment scheduled for 03/29/21  Additional Postpartum F/U:2 hour GTT  High risk pregnancy complicated by: GDM Delivery mode:  Vaginal, Spontaneous  Anticipated Birth Control:  Unsure  Precious Gilding PGY1  GME ATTESTATION:  I saw and evaluated the patient. I agree with the findings and the plan of care as documented in the residents note and have made all necessary  edits.  Renard Matter, MD, MPH OB Fellow, [place] for [place] 02/28/2021 2:40 PM

## 2021-02-26 NOTE — Lactation Note (Signed)
This note was copied from a baby's chart. Lactation Consultation Note  Patient Name: Amber Alvarez JOINO'M Date: 02/26/2021 Reason for consult: L&D Initial assessment Age:22 hours Mom feeding choice:  is breast and bottle feeding. Dad signed consent as [place] representative. Lot's  of education about colostrum and that it is enough for baby. Mom wanted to see if she has milk for baby and asked LC about hand expression  LC used doll module and explained normally hand expression is not done in L&D. Mom expressed 2 mls easily on spoon that was spoon fed to infant and LC reinforced infant's small tummy size and that mom's colostrum is enough for infant. Mom latched infant on her right breast using the football hold position, infant latched with depth and breastfeed for 17 minutes. LC discussed with parents that if mom needs further assistance with latching infant at the breast that RN/LC is available to assist mom on the [facility].  Mom knows to breastfeed infant by primal cues, skin to skin. LC congratulated parents on the birth of their son. Maternal Data    Feeding Mother's Current Feeding Choice: Breast Milk and Formula  LATCH Score Latch: Grasps breast easily, tongue down, lips flanged, rhythmical sucking.  Audible Swallowing: Spontaneous and intermittent  Type of Nipple: Everted at rest and after stimulation  Comfort (Breast/Nipple): Soft / non-tender  Hold (Positioning): Full assist, staff holds infant at breast  LATCH Score: 8   Lactation Tools Discussed/Used    Interventions Interventions: Assisted with latch;Skin to skin;Breast compression;Adjust position;Support pillows;Expressed milk;Position options;Education  Discharge    Consult Status Consult Status: Follow-up from L&D    Danelle Earthly 02/26/2021, 4:29 PM

## 2021-02-26 NOTE — Progress Notes (Signed)
Amber Alvarez is a 21 y.o. G1P0 at [redacted]w[redacted]d  admitted for induction of labor due to A1GDM.  Labored down for ~1.5 hr  Temp 100.3 and fetal tachycardia in 170s.   -Treat for presumed chorio- Amp and Gent and also give Tylenol 1000mg  -Started pushing, +1 station. Can see head moving with pushing. Will continue pushing.   , MD, MPH OB Fellow, Faculty Practice

## 2021-02-26 NOTE — Lactation Note (Signed)
This note was copied from a baby's chart. Lactation Consultation Note  Patient Name: Amber Alvarez TTSVX'B Date: 02/26/2021 Reason for consult: Follow-up assessment;RN request Age:22 hours P1, term female infant. Parents feeding preference is breast and formula feeding. There were bottles of formula on counter dad asked about Enfamil products, LC asked dad's religion which is Muslim, LC discussed appropriate formula is Haal which is [employer].  LC entered room, infant was latched on mom's right breast using the football hold position, infant latched with depth, breastfeed for 15 minutes. RN asked LC to bring hand pump, mom shown how  to use and  mom easily expressed 3 mls of colostrum that was spoon fed to infant.  Mom shown how to use hand pump & how to disassemble, clean, & reassemble parts.  Parents understand Breast milk is good for 4 hours at room temperature whereas formula  has to be used within 1 hour. Mom knows to call RN/LC if she need further latch assistance. Mom will continue to breastfeed infant according to feeding cues, 8 to 12+ or more times within 24 hours, skin to skin. Mom made aware of O/P services, breastfeeding support groups, community resources, and our phone # for post-discharge questions.    Maternal Data    Feeding Mother's Current Feeding Choice: Breast Milk and Formula  LATCH Score Latch: Grasps breast easily, tongue down, lips flanged, rhythmical sucking.  Audible Swallowing: Spontaneous and intermittent  Type of Nipple: Everted at rest and after stimulation  Comfort (Breast/Nipple): Soft / non-tender  Hold (Positioning): Assistance needed to correctly position infant at breast and maintain latch.  LATCH Score: 9   Lactation Tools Discussed/Used Tools: Pump Pump Education: Setup, frequency, and cleaning;Milk Storage Reason for Pumping: RN requested hand pump for mom, prn Pumping frequency: prn Pumped volume: 3 mL (spoon fed to  infant.)  Interventions Interventions: Breast compression;Position options;Hand pump;Education;LC Services brochure  Discharge    Consult Status Consult Status: Follow-up Date: 02/27/21 Follow-up type: In-patient    Danelle Earthly 02/26/2021, 10:05 PM

## 2021-02-26 NOTE — Progress Notes (Signed)
Labor Progress Note Amber Alvarez is a 21 y.o. G1P0 at [redacted]w[redacted]d who presented for IOL due to A1GDM.   S: Doing well. Still comfortable with epidural. No concerns.  O:  BP 118/77    Pulse (!) 105    Temp 98.5 F (36.9 C)    Resp 16    Ht 5\' 4"  (1.626 m)    Wt 83.9 kg    LMP 05/22/2020 (Exact Date)    SpO2 98%    BMI 31.76 kg/m   EFM: Baseline 150 bpm, moderate variability, + accels, no decels  Toco: Every 2-3 minutes   CVE: Dilation: 4.5 Effacement (%): 50 Cervical Position: Anterior Station: -2 Presentation: Vertex Exam by:: [email or web address], MD   A&P: 21 y.o. G1P0 [redacted]w[redacted]d   #Labor: SVE similar to prior. Fetal head well applied. AROM performed with clear fluid. Mom and baby tolerated well. Will continue Pitocin and reassess if 2-3 hours. If unchanged on next check, will plan for IUPC placement. #Pain: Epidural  #FWB: Cat 1  #GBS negative  #A1GDM: CBGs all within normal range. Will continue to check every 4 hours.  [redacted]w[redacted]d, MD 2:56 AM

## 2021-02-26 NOTE — Progress Notes (Signed)
Amber Alvarez is a 21 y.o. G1P0 at [redacted]w[redacted]d admitted for induction of labor due to A1GDM.  Subjective: Feeling some intermittent vaginal and rectal pressure  Objective: BP 118/75    Pulse 97    Temp 98.4 F (36.9 C) (Oral)    Resp 18    Ht 5\' 4"  (1.626 m)    Wt 83.9 kg    LMP 05/22/2020 (Exact Date)    SpO2 98%    BMI 31.76 kg/m  I/O last 3 completed shifts: In: 688.3 [I.V.:688.3] Out: 1000 [Urine:1000] No intake/output data recorded.  FHT:  FHR: 150s-160 bpm, variability: moderate,  accelerations:  Abscent,  decelerations:  Absent UC:   regular, every 1-3 minutes SVE:   Dilation: 10 Effacement (%): 100 Station: 0, Plus 1 Exam by:: Dr. [email or web address]  Labs: Lab Results  Component Value Date   WBC 9.8 02/25/2021   HGB 12.9 02/25/2021   HCT 38.4 02/25/2021   MCV 92.1 02/25/2021   PLT 231 02/25/2021    Assessment / Plan: G1P0 at [redacted]w[redacted]d admitted for induction of labor due to A1GDM.  Labor:  cervix now fully dilated. Station 0 to +1. Will plan to labor down for 1-1.5 hours. If patient feeling constant pressure can start pushing   [redacted]w[redacted]d 02/26/2021, 10:00 AM

## 2021-02-26 NOTE — Progress Notes (Signed)
Pharmacy Antibiotic Note  Amber Alvarez is a 21 y.o. female [redacted]w[redacted]d gestation admitted on 02/25/2021 for L&D, developed fever, harmacy has been consulted for gentamicin dosing for chorioamnionitis, also started ampicillin. GBS neg  Plan: Gentamicin 330 mg (5mg /kg ABW) IV Q 24 hrs For L&D progress and duration of treatment  Height: 5\' 4"  (162.6 cm) Weight: 83.9 kg (185 lb) IBW/kg (Calculated) : 54.7  Temp (24hrs), Avg:98.5 F (36.9 C), Min:98 F (36.7 C), Max:100.3 F (37.9 C)  Recent Labs  Lab 02/25/21 0131  WBC 9.8  CREATININE 0.47    Estimated Creatinine Clearance: 116.6 mL/min (by C-G formula based on SCr of 0.47 mg/dL).    Allergies  Allergen Reactions   Beef-Derived Products     Not an allergy, halal dietary restriction   Chicken Protein     Pt cannot eat meats that are not Halal certified. This is not an allergy.    Fish-Derived Products     Pt cannot eat meats and seafood that are not Halal certified. This is not an allergy.    Pork-Derived Products     Not an allergy, halal dietary restriction    Antimicrobials this admission: Ampicillin 1/28 >> Gentamicin 1/28 >>  Dose adjustments this admission:   Microbiology results:  Thank you for allowing pharmacy to be a part of this patients care.  2/28, PharmD, BCPS, BCPPS Clinical Pharmacist  Pager: [phone]  02/26/2021 10:52 AM

## 2021-02-26 NOTE — Lactation Note (Signed)
This note was copied from a baby's chart. Lactation Consultation Note  Patient Name: Boy Ailie Gage QASTM'H Date: 02/26/2021 Reason for consult: L&D Initial assessment Age:22 hours  LC went in to see help with breastfeeding. Dad stated assisted with LC services in L and D, would like to be seen later.  LC reviewed chart, infant fed 2 ml of colostrum on spoon followed  by 17 min latch.  Mom trying to eat at this time.   Parents to call for [employer] assistance when needed.   Maternal Data    Feeding Mother's Current Feeding Choice: Breast Milk and Formula  LATCH Score Latch: Grasps breast easily, tongue down, lips flanged, rhythmical sucking.  Audible Swallowing: Spontaneous and intermittent  Type of Nipple: Everted at rest and after stimulation  Comfort (Breast/Nipple): Soft / non-tender  Hold (Positioning): Full assist, staff holds infant at breast  LATCH Score: 8   Lactation Tools Discussed/Used    Interventions Interventions: Assisted with latch;Skin to skin;Breast compression;Adjust position;Support pillows;Expressed milk;Position options;Education  Discharge    Consult Status Consult Status: Follow-up from L&D    Kalani Sthilaire  N[place] 02/26/2021, 5:33 PM

## 2021-02-26 NOTE — Plan of Care (Signed)
Problem: Elimination: Goal: Will not experience complications related to urinary retention Outcome: Completed/Met   Problem: Safety: Goal: Ability to remain free from injury will improve Outcome: Completed/Met   

## 2021-02-27 NOTE — Progress Notes (Signed)
POSTPARTUM PROGRESS NOTE  Subjective: Amber Alvarez is a 21 y.o. G1P1001  PPD#1 s/p SVD at [redacted]w[redacted]d.  She reports she doing well. No acute events overnight. She denies any problems with ambulating, voiding or po intake. Denies nausea or vomiting. She has  passed flatus. Pain is well controlled.  Lochia is scant.  Objective: Blood pressure 112/75, pulse 86, temperature (!) 97.4 F (36.3 C), temperature source Oral, resp. rate 18, height 5\' 4"  (1.626 m), weight 83.9 kg, last menstrual period 05/22/2020, SpO2 98 %, unknown if currently breastfeeding.  Physical Exam:  General: alert, cooperative and no distress Chest: no respiratory distress Abdomen: soft, non-tender  Uterine Fundus: firm, appropriately tender Extremities: No calf swelling or tenderness   no edema  Recent Labs    02/25/21 0131  HGB 12.9  HCT 38.4    Assessment/Plan: Amber Alvarez is a 21 y.o. G1P1001  PPD#1 s/p SVD at [redacted]w[redacted]d  Routine Postpartum Care: Doing well, pain well-controlled.  -- Continue routine care, lactation support  -- Contraception: unsure -- Feeding: breast and formula  #Triple I Patient developed chorioamnionitis (fetal tachycardia and uptrending maternal temp Tmax at 100.3) during labor and was treated with Gentamycin and Ampicillin during labor as well as for 24 hours after. Her Tmax was 100.3 24 hours ago and she has been afebrile and asymptomatic since delivery.  Dispo: Plan for discharge PPD#2.  [redacted]w[redacted]d, MD, MPH OB Fellow, [facility]

## 2021-02-27 NOTE — Lactation Note (Addendum)
This note was copied from a baby's chart. Lactation Consultation Note  Patient Name: Amber Alvarez INOMV'E Date: 02/27/2021 Reason for consult: Follow-up assessment;Maternal endocrine disorder (GDM) Age:22 hours  FOB interpreting.  P1, Mother would like to breastfeed and formula feed. Recommend offering breast before formula to help establish milk supply.  Feed on demand with cues.  Goal 8-12+ times per day after first 24 hrs.  Place baby STS if not cueing.  Baby recently was given 15 ml formula. Family knows to call RN or LC for latch assistance.   Maternal Data Does the patient have breastfeeding experience prior to this delivery?: No  Feeding Mother's Current Feeding Choice: Breast Milk and Formula   Interventions Interventions: Breast feeding basics reviewed;Education  Discharge    Consult Status Consult Status: Follow-up Date: 02/28/21 Follow-up type: In-patient    Dahlia Byes [facility] 02/27/2021, 12:05 PM

## 2021-02-27 NOTE — Anesthesia Postprocedure Evaluation (Signed)
Anesthesia Post Note  Patient: Amber Alvarez  Procedure(s) Performed: AN AD HOC LABOR EPIDURAL     Patient location during evaluation: Mother Baby Anesthesia Type: Epidural Level of consciousness: awake and alert Pain management: pain level controlled Vital Signs Assessment: post-procedure vital signs reviewed and stable Respiratory status: spontaneous breathing, nonlabored ventilation and respiratory function stable Cardiovascular status: stable Postop Assessment: no headache, no backache, epidural receding, no apparent nausea or vomiting, patient able to bend at knees, adequate PO intake and able to ambulate Anesthetic complications: no   No notable events documented.  Last Vitals:  Vitals:   02/27/21 0137 02/27/21 0618  BP: 98/61 112/75  Pulse: 84 86  Resp: 18 18  Temp: 36.7 C (!) 36.3 C  SpO2:      Last Pain:  Vitals:   02/27/21 0618  TempSrc: Oral  PainSc:    Pain Goal:                   Laban Emperor

## 2021-02-27 NOTE — Plan of Care (Signed)
Problem: Clinical Measurements: Goal: Ability to maintain clinical measurements within normal limits will improve Outcome: Completed/Met Goal: Cardiovascular complication will be avoided Outcome: Completed/Met   Problem: Elimination: Goal: Will not experience complications related to urinary retention Outcome: Completed/Met   Problem: Pain Managment: Goal: General experience of comfort will improve Outcome: Completed/Met   Problem: Safety: Goal: Ability to remain free from injury will improve Outcome: Completed/Met

## 2021-02-28 LAB — GLUCOSE, CAPILLARY: Glucose-Capillary: 97 mg/dL (ref 70–99)

## 2021-02-28 MED ORDER — IBUPROFEN 600 MG PO TABS: 600.0000 mg | ORAL_TABLET | Freq: Four times a day (QID) | ORAL | 0 refills | Status: DC

## 2021-02-28 MED ORDER — ACETAMINOPHEN 325 MG PO TABS: 650.0000 mg | ORAL_TABLET | ORAL | 0 refills | Status: DC | PRN

## 2021-02-28 NOTE — Lactation Note (Addendum)
This note was copied from a baby's chart. Lactation Consultation Note  Patient Name: Amber Alvarez Date: 02/28/2021 Reason for consult: Other (Comment) (Mom is on [employer] plan; pump provided) Age:22 hours  Mom knows to offer breast before offering formula and if she doesn't (or if infant doesn't latch well), she will pump. Mom has been putting infant to breast every now and then. Infant will latch, sometimes feeding for about 15 min before Mom removes him from breast.  Feeding at breast was attempted during consult per parental request. Infant latched briefly, but was not interested in feeding at that moment. Mom's breasts are heavier today. Mom will likley have an abundant supply once her milk comes to volume.  Parents are on [employer]; parents chose the [facility] pump. Mom was more comfortable with hand pump with size 21 flange (the [employer] pump has size 21 flanges).   Education was done with parents on the benefits of breast milk versus formula. Parents know how to reach [place] for post-discharge questions.   [name] [facility] 02/28/2021, 11:02 AM

## 2021-02-28 NOTE — Progress Notes (Signed)
Patient's fasting blood sugar taken around 0500 was not a true fasting this morning. She had eaten a sweet pastry about 1 hour prior to me recording the blood sugar. However, the reading was low at 97.

## 2021-03-09 ENCOUNTER — Telehealth (HOSPITAL_COMMUNITY): Payer: Self-pay | Admitting: *Deleted

## 2021-03-09 NOTE — Telephone Encounter (Signed)
Attempted Hospital Discharge Follow Up Call with help of telephonic interpreter "[place] [phone]".  No answer.

## 2021-03-29 ENCOUNTER — Ambulatory Visit: Payer: No Typology Code available for payment source | Admitting: Nurse Practitioner

## 2021-03-29 ENCOUNTER — Other Ambulatory Visit: Payer: Self-pay

## 2021-03-29 ENCOUNTER — Other Ambulatory Visit: Payer: No Typology Code available for payment source

## 2021-03-29 DIAGNOSIS — O24410 Gestational diabetes mellitus in pregnancy, diet controlled: Secondary | ICD-10-CM

## 2021-03-30 ENCOUNTER — Other Ambulatory Visit: Payer: No Typology Code available for payment source

## 2021-03-30 ENCOUNTER — Other Ambulatory Visit (HOSPITAL_COMMUNITY)
Admission: RE | Admit: 2021-03-30 | Discharge: 2021-03-30 | Disposition: A | Discharge status: Home / Self Care | Payer: No Typology Code available for payment source | Source: Ambulatory Visit | Attending: Obstetrics & Gynecology | Admitting: Obstetrics & Gynecology

## 2021-03-30 ENCOUNTER — Encounter: Payer: Self-pay | Admitting: Obstetrics & Gynecology

## 2021-03-30 ENCOUNTER — Other Ambulatory Visit: Payer: Self-pay

## 2021-03-30 ENCOUNTER — Ambulatory Visit (INDEPENDENT_AMBULATORY_CARE_PROVIDER_SITE_OTHER): Payer: No Typology Code available for payment source | Admitting: Obstetrics & Gynecology

## 2021-03-30 DIAGNOSIS — Z124 Encounter for screening for malignant neoplasm of cervix: Secondary | ICD-10-CM | POA: Insufficient documentation

## 2021-03-30 DIAGNOSIS — K649 Unspecified hemorrhoids: Secondary | ICD-10-CM

## 2021-03-30 DIAGNOSIS — K5900 Constipation, unspecified: Secondary | ICD-10-CM

## 2021-03-30 DIAGNOSIS — Z8632 Personal history of gestational diabetes: Secondary | ICD-10-CM

## 2021-03-30 NOTE — Progress Notes (Signed)
? ? ?Post Partum Visit Note ? ?Amber Alvarez is a 21 y.o. G35P1001 female who presents for a postpartum visit.  Accompanied by her husband, he is a physician and interprets for her.  She is 4 weeks and 4 days postpartum following a normal spontaneous vaginal delivery.  I have fully reviewed the prenatal and intrapartum course. The delivery was at [redacted]w[redacted]d gestational weeks.  Anesthesia: epidural. Postpartum course has been going well per patient. Baby is doing well. Baby is feeding by bottle - Similac Advance. Bleeding no bleeding. Bowel function is abnormal: patient states she is experiencing constipation  but takes senna and that helps. Bladder function is normal. Patient is not sexually active. Contraception method is none. Postpartum depression screening: negative. ? ? ?The pregnancy intention screening data noted above was reviewed. Potential methods of contraception were discussed. The patient elected to proceed with no method for now. ? ? Edinburgh Postnatal Depression Scale - 03/30/21 0846   ? ?  ? Edinburgh Postnatal Depression Scale:  In the Past 7 Days  ? I have been able to laugh and see the funny side of things. 1   ? I have looked forward with enjoyment to things. 0   ? I have blamed myself unnecessarily when things went wrong. 0   ? I have been anxious or worried for no good reason. 2   ? I have felt scared or panicky for no good reason. 2   ? Things have been getting on top of me. 0   ? I have been so unhappy that I have had difficulty sleeping. 0   ? I have felt sad or miserable. 0   ? I have been so unhappy that I have been crying. 0   ? The thought of harming myself has occurred to me. 0   ? Edinburgh Postnatal Depression Scale Total 5   ? ?  ?  ? ?  ? ? ?Health Maintenance Due  ?Topic Date Due  ? COVID-19 Vaccine (1) Never done  ? URINE MICROALBUMIN  Never done  ? HPV VACCINES (1 - 2-dose series) Never done  ? PAP-Cervical Cytology Screening  Never done  ? PAP SMEAR-Modifier  Never done  ? ? ?The  following portions of the patient's history were reviewed and updated as appropriate: allergies, current medications, past family history, past medical history, past social history, past surgical history, and problem list. ? ?Review of Systems ?Pertinent items noted in HPI and remainder of comprehensive ROS otherwise negative. ? ?Objective:  ?BP 115/69   Pulse 67   Wt 165 lb (74.8 kg)   LMP 05/22/2020 (Exact Date)   BMI 28.32 kg/m?   ? ?General:  alert and no distress  ? Breasts:  normal  ?Lungs: clear to auscultation bilaterally  ?Heart:  regular rate and rhythm  ?Abdomen: soft, non-tender; bowel sounds normal; no masses,  no organomegaly   ?GU exam: normal appearing external genitalia and urethral meatus; healing perineal laceration with some visible sutures, normal appearing vaginal mucosa and cervix, no abnormal discharge noted, pap smear obtained.  Done in presence of RN as chaperone.  ?     ?Assessment:  ?Normal postpartum exam.  ? ?Plan:  ? ?Essential components of care per ACOG recommendations: ? ?1.  Mood and well being: Patient with negative depression screening today. Reviewed local resources for support.  ?- Patient tobacco use? No.   ?- hx of drug use? No.   ? ?2. Infant care and feeding:  ?-Patient currently  breastmilk feeding? No.  ?-Social determinants of health (SDOH) reviewed in EPIC. No concerns. ? ?3. Sexuality, contraception and birth spacing ?- Patient does not want a pregnancy in the next year.  Desired family size is 3 children.  ?- Reviewed forms of contraception in tiered fashion. Patient desired no method today.  Discussed fertility awareness methods, condoms as possible choices.  ?- Discussed birth spacing of 18 months ? ?4. Sleep and fatigue ?-Encouraged family/partner/community support of 4 hrs of uninterrupted sleep to help with mood and fatigue ? ?5. Physical Recovery  ?- Discussed patients delivery and complications. She describes her labor as good. ?- Patient had a Vaginal, no  problems at delivery. Patient had a 2nd degree laceration. Perineal healing reviewed. Patient expressed understanding ?- Patient has urinary incontinence? No. ?- Patient is not safe to resume physical and sexual activity yet, advised to wait 2-3 weeks more. ? ?6.  Health Maintenance ?- HM due items addressed Yes ?- Pap smear done at today's visit, added on HPV testing as per her request. Patient and husband desired HPV testing, want to increase pap interval to 5 five years as pelvic exams are rough for her. ? ?7. Chronic Disease/Pregnancy Condition follow up: Gestational Diabetes ?- Declines 2 hr GTT, want to check Hemoglobin A1C in about 2- 3 months. Future order placed. ?- Will also follow up  with PCP ? ?Jaynie Collins, MD ?Center for [name], [facility]  ?

## 2021-04-01 LAB — CYTOLOGY - PAP
Comment: NEGATIVE
Diagnosis: NEGATIVE
High risk HPV: NEGATIVE

## 2021-09-27 ENCOUNTER — Other Ambulatory Visit: Payer: Self-pay | Admitting: Internal Medicine

## 2021-09-27 DIAGNOSIS — N912 Amenorrhea, unspecified: Secondary | ICD-10-CM

## 2021-09-30 ENCOUNTER — Ambulatory Visit
Admission: RE | Admit: 2021-09-30 | Discharge: 2021-09-30 | Disposition: A | Discharge status: Home / Self Care | Payer: No Typology Code available for payment source | Source: Ambulatory Visit | Attending: Internal Medicine | Admitting: Internal Medicine

## 2021-09-30 DIAGNOSIS — N912 Amenorrhea, unspecified: Secondary | ICD-10-CM

## 2021-10-27 ENCOUNTER — Encounter: Payer: Self-pay | Admitting: General Practice

## 2021-10-27 ENCOUNTER — Ambulatory Visit (INDEPENDENT_AMBULATORY_CARE_PROVIDER_SITE_OTHER): Payer: No Typology Code available for payment source | Admitting: General Practice

## 2021-10-27 DIAGNOSIS — Z3201 Encounter for pregnancy test, result positive: Secondary | ICD-10-CM | POA: Diagnosis not present

## 2021-10-27 LAB — POCT PREGNANCY, URINE: Preg Test, Ur: POSITIVE — AB

## 2021-10-27 NOTE — Progress Notes (Signed)
Patient came by office and dropped off urine sample for UPT. UPT +.  Called patient at home, no answer- left message to check mychart account for more information.  Koren Bound RN BSN 10/27/21

## 2021-10-28 ENCOUNTER — Telehealth: Payer: Self-pay | Admitting: Family Medicine

## 2021-10-28 NOTE — Telephone Encounter (Signed)
Patients spouse was called to set up new ob appointments, when setting up the appointments he stated his wife had been missing some of her periods and when they went to an appointment at her primary care [facility] physicians they did a pregnancy test and it was negative. He is wanting to know if a dating ultrasound can be done. He requested a call back from a nurse.

## 2021-10-31 ENCOUNTER — Telehealth: Payer: Self-pay | Admitting: Family Medicine

## 2021-10-31 DIAGNOSIS — Z3490 Encounter for supervision of normal pregnancy, unspecified, unspecified trimester: Secondary | ICD-10-CM

## 2021-10-31 DIAGNOSIS — O3680X0 Pregnancy with inconclusive fetal viability, not applicable or unspecified: Secondary | ICD-10-CM

## 2021-10-31 NOTE — Telephone Encounter (Signed)
Patient requesting a dating ultra sound.

## 2021-10-31 NOTE — Telephone Encounter (Signed)
Call placed back to pt's husband, ok per DPR. Spoke with husband. States needing to have viability ultrasound due to having irregular cycles for past 2 months. Unsure of dating. Pt scheduled for [place] on 10/10 at 10:30am. Verbalized understanding and agreeable to date and time of appt. Pt has scheduled new OB intake on 10/5 at 2:15pm. Husband aware.  Colletta Maryland, RNC Orders placed Has New OB appt with Dr Harolyn Rutherford on 12/05/21. Colletta Maryland, RNC

## 2021-10-31 NOTE — Telephone Encounter (Signed)
Call placed back to pt with CAP interpreter # [phone]. Pt did not answer, left VM to return call to office. Made appt note for New OB intake on 10/5 for possible viability [place].  Colletta Maryland, RNC

## 2021-11-03 ENCOUNTER — Telehealth (INDEPENDENT_AMBULATORY_CARE_PROVIDER_SITE_OTHER): Payer: No Typology Code available for payment source

## 2021-11-03 DIAGNOSIS — Z8632 Personal history of gestational diabetes: Secondary | ICD-10-CM | POA: Insufficient documentation

## 2021-11-03 DIAGNOSIS — Z3689 Encounter for other specified antenatal screening: Secondary | ICD-10-CM

## 2021-11-03 DIAGNOSIS — Z3480 Encounter for supervision of other normal pregnancy, unspecified trimester: Secondary | ICD-10-CM | POA: Insufficient documentation

## 2021-11-03 HISTORY — DX: Personal history of gestational diabetes: Z86.32

## 2021-11-03 NOTE — Progress Notes (Signed)
New OB Intake  I connected with  Catha Nottingham on 11/03/21 at  2:15 PM EDT by MyChart Video Visit and verified that I am speaking with the correct person using two identifiers. Nurse is located at [facility] and pt is located at home. Patient's husband Rosine Beat interprets for patient.   I discussed the limitations, risks, security and privacy concerns of performing an evaluation and management service by telephone and the availability of in person appointments. I also discussed with the patient that there may be a patient responsible charge related to this service. The patient expressed understanding and agreed to proceed.  I explained I am completing New OB Intake today.  Pt is G2/P1. I reviewed her allergies, medications, Medical/Surgical/OB history, and appropriate screenings. I informed her of [facility] services. [facility] information placed in AVS. Based on history, this is an  pregnancy   Patient Active Problem List   Diagnosis Date Noted   Supervision of other normal pregnancy, antepartum 11/03/2021   History of gestational diabetes 11/03/2021   History of cholelithiasis 02/06/2021   Language barrier 02/03/2021    Concerns addressed today:  -Patient is unsure of her LMP. Dating ultrasound is scheduled.  -Hx of gestational diabetes, informed patient will have to do a early 2 hours gtt. Patient understood.   Delivery Plans Plans to deliver at [facility]. Patient given information for [facility] Healthy Baby website for more information about Women's and [place]. Patient is not interested in water birth. Offered upcoming OB visit with CNM to discuss further.  MyChart/Babyscripts MyChart access verified. I explained pt will have some visits in office and some virtually. Babyscripts instructions given and order placed. Patient verifies receipt of registration text/e-mail. Account successfully created and app downloaded.  Blood Pressure Cuff/Weight Scale -Patient has private insurance. Encourage to  purchase a blood pressure device.   Anatomy US: Unsure of LMP. Dating ultrasound is scheduled on 11/08/2021.   Labs Discussed Johnsie Cancel genetic screening with patient. Would like both Panorama and Horizon drawn at new OB visit. Routine prenatal labs needed.  Covid Vaccine Patient has covid vaccine.   Is patient a CenteringPregnancy candidate?  Not a candidate due to [place]  Centering Patient" indicated on sticky note   Is patient a Mom+Baby Combined Care candidate?  Not a candidate     Social Determinants of Health Food Insecurity: Patient denies food insecurity. WIC Referral: Patient is interested in referral to [facility].  Transportation: Patient denies transportation needs. Childcare: Discussed no children allowed at ultrasound appointments. Offered childcare services; patient declines childcare services at this time.  First visit review I reviewed new OB appt with pt. I explained she will have a provider visit that includes initial ob labs, genetic screening, and pelvic exam. Explained pt will be seen by Osborne Oman MD at first visit; encounter routed to appropriate provider. Explained that patient will be seen by pregnancy navigator following visit with provider.   Mariane Baumgarten, [place] 11/03/2021  2:38 PM

## 2021-11-08 ENCOUNTER — Ambulatory Visit (INDEPENDENT_AMBULATORY_CARE_PROVIDER_SITE_OTHER): Payer: No Typology Code available for payment source

## 2021-11-08 ENCOUNTER — Other Ambulatory Visit: Payer: Self-pay | Admitting: Obstetrics & Gynecology

## 2021-11-08 ENCOUNTER — Other Ambulatory Visit: Payer: Self-pay

## 2021-11-08 DIAGNOSIS — Z3A01 Less than 8 weeks gestation of pregnancy: Secondary | ICD-10-CM

## 2021-11-08 DIAGNOSIS — Z3490 Encounter for supervision of normal pregnancy, unspecified, unspecified trimester: Secondary | ICD-10-CM

## 2021-11-08 DIAGNOSIS — O3680X0 Pregnancy with inconclusive fetal viability, not applicable or unspecified: Secondary | ICD-10-CM

## 2021-11-08 DIAGNOSIS — Z3491 Encounter for supervision of normal pregnancy, unspecified, first trimester: Secondary | ICD-10-CM | POA: Diagnosis not present

## 2021-11-08 MED ORDER — ONDANSETRON 8 MG PO TBDP: 8.0000 mg | ORAL_TABLET | Freq: Three times a day (TID) | ORAL | 2 refills | Status: DC | PRN

## 2021-11-08 MED ORDER — PROMETHAZINE HCL 25 MG PO TABS: 25.0000 mg | ORAL_TABLET | Freq: Four times a day (QID) | ORAL | 1 refills | Status: DC | PRN

## 2021-11-08 NOTE — Progress Notes (Signed)
   GYNECOLOGY OFFICE VISIT NOTE  History:   Amber Alvarez is a 21 y.o. G2P1001 here today for ultrasound results.  Ultrasound shows live IUP measuring 7 weeks 6 days with FHR 169 bpm.  Abdominal pain: No   Vaginal bleeding: No   OB History  Gravida Para Term Preterm AB Living  2 1 1     1   SAB IAB Ectopic Multiple Live Births        0 1    # Outcome Date GA Lbr Len/2nd Weight Sex Delivery Anes PTL Lv  2 Gravida           1 Term 02/26/21 [redacted]w[redacted]d 09:12 / 04:46 9 lb 2.6 oz (4.155 kg) M Vag-Spont EPI  LIV     Health Maintenance Due  Topic Date Due   COVID-19 Vaccine (1) Never done   HPV VACCINES (1 - 2-dose series) Never done   INFLUENZA VACCINE  08/30/2021    Past Medical History:  Diagnosis Date   Gestational diabetes    A1GDM   Gestational diabetes mellitus, class A1 02/25/2021    Past Surgical History:  Procedure Laterality Date   NO PAST SURGERIES      The following portions of the patient's history were reviewed and updated as appropriate: allergies, current medications, past family history, past medical history, past social history, past surgical history and problem list.   Review of Systems:  Pertinent items noted in HPI and remainder of comprehensive ROS otherwise negative.  Physical Exam:  LMP 05/22/2020 (Exact Date)   Breastfeeding Unknown   CONSTITUTIONAL: Well-developed, well-nourished female in no acute distress.  HEENT:  Normocephalic, atraumatic. External right and left ear normal. No scleral icterus.  NECK: Normal range of motion, supple, no masses noted on observation SKIN: No rash noted. Not diaphoretic. No erythema. No pallor. MUSCULOSKELETAL: Normal range of motion. No edema noted. NEUROLOGIC: Alert and oriented to person, place, and time. Normal muscle tone coordination.  PSYCHIATRIC: Normal mood and affect. Normal behavior. Normal judgment and thought content. RESPIRATORY: Effort normal, no problems with respiration noted  Assessment and Plan:   Viable Pregnancy - Z34.90  Reviewed results with patient that show a viable intrauterine pregnancy at 7 weeks of gestation. Recommended she contact providers to start prenatal care, and she was given a list of options in her AVS. Prescription sent for prenatal vitamins. Reviewed ED precautions including vaginal bleeding like a period or heavier, severe abdominal pain, and fever. All questions answered.   Please refer to After Visit Summary for other counseling recommendations.   Patient to follow up to establish prenatal care as soon as possible  Total face-to-face time with patient: 20 minutes.  Over 50% of encounter was spent on counseling and coordination of care.   Wende Mott, [place] for [employer], [place]

## 2021-11-08 NOTE — Patient Instructions (Signed)

## 2021-12-05 ENCOUNTER — Encounter: Payer: Self-pay | Admitting: Obstetrics & Gynecology

## 2021-12-05 ENCOUNTER — Ambulatory Visit (INDEPENDENT_AMBULATORY_CARE_PROVIDER_SITE_OTHER): Payer: No Typology Code available for payment source | Admitting: Obstetrics & Gynecology

## 2021-12-05 ENCOUNTER — Other Ambulatory Visit: Payer: Self-pay

## 2021-12-05 ENCOUNTER — Other Ambulatory Visit (HOSPITAL_COMMUNITY)
Admission: RE | Admit: 2021-12-05 | Discharge: 2021-12-05 | Disposition: A | Discharge status: Home / Self Care | Payer: No Typology Code available for payment source | Source: Ambulatory Visit | Attending: Obstetrics & Gynecology | Admitting: Obstetrics & Gynecology

## 2021-12-05 VITALS — Wt 166.8 lb

## 2021-12-05 DIAGNOSIS — O0990 Supervision of high risk pregnancy, unspecified, unspecified trimester: Secondary | ICD-10-CM | POA: Insufficient documentation

## 2021-12-05 DIAGNOSIS — Z8632 Personal history of gestational diabetes: Secondary | ICD-10-CM

## 2021-12-05 DIAGNOSIS — O09891 Supervision of other high risk pregnancies, first trimester: Secondary | ICD-10-CM

## 2021-12-05 DIAGNOSIS — Z3491 Encounter for supervision of normal pregnancy, unspecified, first trimester: Secondary | ICD-10-CM | POA: Diagnosis present

## 2021-12-05 DIAGNOSIS — O09899 Supervision of other high risk pregnancies, unspecified trimester: Secondary | ICD-10-CM | POA: Insufficient documentation

## 2021-12-05 DIAGNOSIS — Z23 Encounter for immunization: Secondary | ICD-10-CM | POA: Diagnosis not present

## 2021-12-05 DIAGNOSIS — O2341 Unspecified infection of urinary tract in pregnancy, first trimester: Secondary | ICD-10-CM

## 2021-12-05 DIAGNOSIS — Z3A11 11 weeks gestation of pregnancy: Secondary | ICD-10-CM

## 2021-12-05 MED ORDER — ASPIRIN 81 MG PO TBEC: 81.0000 mg | DELAYED_RELEASE_TABLET | Freq: Every day | ORAL | 2 refills | Status: DC

## 2021-12-05 NOTE — Patient Instructions (Signed)

## 2021-12-05 NOTE — Progress Notes (Signed)
History:   Amber Alvarez is a 21 y.o. G2P1001 at [redacted]w[redacted]d by early ultrasound being seen today for her first obstetrical visit.  Accompanied by her husband, he is a physician and interprets for her (refusal of interpreter form signed and in chart).  Her obstetrical history is significant for  A1GDM (had normal postpartum A1C of 5.4%) , short interval in between pregnancies as she had her last baby on 7/82/95 (uncomplicated SVD).  Patient does intend to breast and bottle feed. Pregnancy history fully reviewed.  Patient reports  some SOB, occasional ear fullness. Evaluated by her PCP, told it was likely allergic rhinitis and can take antihistamines.  She wanted to know this was okay.  Also occasional shoulder joint pain, she did not take Tylenol for this. No other concerns .     HISTORY: OB History  Gravida Para Term Preterm AB Living  2 1 1  0 0 1  SAB IAB Ectopic Multiple Live Births  0 0 0 0 1    # Outcome Date GA Lbr Len/2nd Weight Sex Delivery Anes PTL Lv  2 Current           1 Term 02/26/21 [redacted]w[redacted]d 09:12 / 04:46 9 lb 2.6 oz (4.155 kg) M Vag-Spont EPI  LIV     Name: Amber Alvarez     Apgar1: 8  Apgar5: 8  Last pap smear was done 03/30/2021 and was normal  Past Medical History:  Diagnosis Date   Gestational diabetes mellitus, class A1 02/25/2021   Postpartum A1C was 5.4 % at her PCPs   Past Surgical History:  Procedure Laterality Date   NO PAST SURGERIES     No family history on file. Social History   Tobacco Use   Smoking status: Never    Passive exposure: Never   Smokeless tobacco: Never  Vaping Use   Vaping Use: Never used  Substance Use Topics   Alcohol use: Never   Drug use: Never   Allergies  Allergen Reactions   Beef-Derived Products     Not an allergy, halal dietary restriction   Chicken Protein     Pt cannot eat meats that are not Halal certified. This is not an allergy.    Fish-Derived Products     Pt cannot eat meats and seafood that are not Halal certified.  This is not an allergy.    Pork-Derived Products     Not an allergy, halal dietary restriction   Current Outpatient Medications on File Prior to Visit  Medication Sig Dispense Refill   ondansetron (ZOFRAN-ODT) 8 MG disintegrating tablet Take 1 tablet (8 mg total) by mouth every 8 (eight) hours as needed for nausea or vomiting. 30 tablet 2   Prenatal Vit-Fe Fumarate-FA (PRENATAL VITAMIN PO) Take 1 tablet by mouth daily.     acetaminophen (TYLENOL) 325 MG tablet Take 2 tablets (650 mg total) by mouth every 4 (four) hours as needed (for pain scale < 4). (Patient not taking: Reported on 11/03/2021) 60 tablet 0   promethazine (PHENERGAN) 25 MG tablet Take 1 tablet (25 mg total) by mouth every 6 (six) hours as needed for nausea or vomiting. (Patient not taking: Reported on 12/05/2021) 30 tablet 1   No current facility-administered medications on file prior to visit.    Review of Systems Pertinent items noted in HPI and remainder of comprehensive ROS otherwise negative.  Physical Exam:   Vitals:   12/05/21 0915  Weight: 166 lb 12.8 oz (75.7 kg)   Fetal Heart Rate (  bpm): 165   General: well-developed, well-nourished female in no acute distress  Breasts:  deferred   Skin: normal coloration and turgor, no rashes  Neurologic: oriented, normal, negative, normal mood  Extremities: normal strength, tone, and muscle mass, Alvarez of all joints is normal  HEENT PERRLA, extraocular movement intact and sclera clear, anicteric  Neck supple and no masses  Cardiovascular: regular rate and rhythm  Respiratory:  no respiratory distress, normal breath sounds  Abdomen: soft, non-tender; bowel sounds normal; no masses,  no organomegaly  Pelvic: deferred    Assessment:    Pregnancy: G2P1001 Patient Active Problem List   Diagnosis Date Noted   Supervision of low-risk pregnancy, first trimester 12/05/2021   History of gestational diabetes 11/03/2021   History of cholelithiasis 02/06/2021   Language barrier  02/03/2021     Plan:    1. History of gestational diabetes - HgB A1c checked today. If prediabetic or more, early 2 hr GTT will be recommended - aspirin EC 81 MG tablet; Take 1 tablet (81 mg total) by mouth daily. Take after 12 weeks for prevention of preeclampsia later in pregnancy  Dispense: 300 tablet; Refill: 2 - TSH Rfx on Abnormal to Free T4 - Comprehensive metabolic panel  2. [redacted] weeks gestation of pregnancy 3. Short interval between pregnancies affecting pregnancy, antepartum 4. Supervision of low-risk pregnancy, first trimester - HORIZON Custom - Panorama Prenatal Test Full Panel - CBC/D/Plt+RPR+Rh+ABO+RubIgG... - Culture, OB Urine - Cervicovaginal ancillary only( [place]) - [place] MFM OB COMP + 14 WK; Future - Flu Vaccine QUAD 36+ mos IM (Fluarix, Quad PF) Initial labs drawn. Continue prenatal vitamins. Problem list reviewed and updated. Genetic Screening discussed, Panorama and Horizon: ordered. Ultrasound discussed; fetal anatomic survey: scheduled. Anticipatory guidance about prenatal visits given including labs, ultrasounds, and testing. Reassured about safety of antihistamines and Tylenol in pregnancy, given list of safe meds in pregnancy. Patient was encouraged to use MyChart to review results, send requests, and have questions addressed.   The nature of [place] for [facility] Healthcare/Faculty Practice with multiple MDs and Advanced Practice Providers was explained to patient; also emphasized that residents, students are part of our team. Routine obstetric precautions reviewed. Encouraged to seek out care at our office or emergency room [facility]U preferred) for urgent and/or emergent concerns. Return in about 4 weeks (around 01/02/2022) for OFFICE OB VISIT (MD only).     Verita Schneiders, MD, [place] for [employer], [place]

## 2021-12-06 LAB — CERVICOVAGINAL ANCILLARY ONLY
Chlamydia: NEGATIVE
Comment: NEGATIVE
Comment: NEGATIVE
Comment: NORMAL
Neisseria Gonorrhea: NEGATIVE
Trichomonas: NEGATIVE

## 2021-12-06 LAB — CBC/D/PLT+RPR+RH+ABO+RUBIGG...
Antibody Screen: NEGATIVE
Basophils Absolute: 0.1 10*3/uL (ref 0.0–0.2)
Basos: 1 %
EOS (ABSOLUTE): 0.1 10*3/uL (ref 0.0–0.4)
Eos: 1 %
HCV Ab: NONREACTIVE
HIV Screen 4th Generation wRfx: NONREACTIVE
Hematocrit: 38.1 % (ref 34.0–46.6)
Hemoglobin: 12.7 g/dL (ref 11.1–15.9)
Hepatitis B Surface Ag: NEGATIVE
Immature Grans (Abs): 0 10*3/uL (ref 0.0–0.1)
Immature Granulocytes: 0 %
Lymphocytes Absolute: 2.4 10*3/uL (ref 0.7–3.1)
Lymphs: 25 %
MCH: 30.3 pg (ref 26.6–33.0)
MCHC: 33.3 g/dL (ref 31.5–35.7)
MCV: 91 fL (ref 79–97)
Monocytes Absolute: 0.6 10*3/uL (ref 0.1–0.9)
Monocytes: 6 %
Neutrophils Absolute: 6.6 10*3/uL (ref 1.4–7.0)
Neutrophils: 67 %
Platelets: 332 10*3/uL (ref 150–450)
RBC: 4.19 x10E6/uL (ref 3.77–5.28)
RDW: 13.2 % (ref 11.7–15.4)
RPR Ser Ql: NONREACTIVE
Rh Factor: POSITIVE
Rubella Antibodies, IGG: 19.5 index (ref >0.99)
WBC: 9.8 10*3/uL (ref 3.4–10.8)

## 2021-12-06 LAB — HEMOGLOBIN A1C
Est. average glucose Bld gHb Est-mCnc: 108 mg/dL
Hgb A1c MFr Bld: 5.4 % (ref 4.8–5.6)

## 2021-12-06 LAB — HCV INTERPRETATION

## 2021-12-06 LAB — COMPREHENSIVE METABOLIC PANEL
ALT: 14 IU/L (ref 0–32)
AST: 17 IU/L (ref 0–40)
Albumin/Globulin Ratio: 1.9 (ref 1.2–2.2)
Albumin: 4.2 g/dL (ref 4.0–5.0)
Alkaline Phosphatase: 72 IU/L (ref 44–121)
BUN/Creatinine Ratio: 17 (ref 9–23)
BUN: 8 mg/dL (ref 6–20)
Bilirubin Total: 0.3 mg/dL (ref 0.0–1.2)
CO2: 19 mmol/L — ABNORMAL LOW (ref 20–29)
Calcium: 9.7 mg/dL (ref 8.7–10.2)
Chloride: 103 mmol/L (ref 96–106)
Creatinine, Ser: 0.48 mg/dL — ABNORMAL LOW (ref 0.57–1.00)
Globulin, Total: 2.2 g/dL (ref 1.5–4.5)
Glucose: 93 mg/dL (ref 70–99)
Potassium: 3.9 mmol/L (ref 3.5–5.2)
Sodium: 137 mmol/L (ref 134–144)
Total Protein: 6.4 g/dL (ref 6.0–8.5)
eGFR: 137 mL/min/{1.73_m2} (ref >59)

## 2021-12-06 LAB — TSH RFX ON ABNORMAL TO FREE T4: TSH: 0.728 u[IU]/mL (ref 0.450–4.500)

## 2021-12-08 ENCOUNTER — Encounter: Payer: Self-pay | Admitting: Obstetrics & Gynecology

## 2021-12-08 DIAGNOSIS — O2341 Unspecified infection of urinary tract in pregnancy, first trimester: Secondary | ICD-10-CM

## 2021-12-08 HISTORY — DX: Unspecified infection of urinary tract in pregnancy, first trimester: O23.41

## 2021-12-08 LAB — URINE CULTURE, OB REFLEX

## 2021-12-08 LAB — CULTURE, OB URINE

## 2021-12-08 MED ORDER — CEFADROXIL 500 MG PO CAPS: 500.0000 mg | ORAL_CAPSULE | Freq: Two times a day (BID) | ORAL | 0 refills | Status: DC

## 2021-12-08 NOTE — Addendum Note (Signed)
Addended by: Jaynie Collins A on: 12/08/2021 04:48 PM   Modules accepted: Orders

## 2021-12-09 LAB — PANORAMA PRENATAL TEST FULL PANEL:PANORAMA TEST PLUS 5 ADDITIONAL MICRODELETIONS: FETAL FRACTION: 14.6

## 2021-12-11 LAB — HORIZON CUSTOM: REPORT SUMMARY: NEGATIVE

## 2022-01-04 ENCOUNTER — Encounter: Payer: Self-pay | Admitting: Family Medicine

## 2022-01-04 ENCOUNTER — Ambulatory Visit (INDEPENDENT_AMBULATORY_CARE_PROVIDER_SITE_OTHER): Payer: No Typology Code available for payment source | Admitting: Family Medicine

## 2022-01-04 ENCOUNTER — Other Ambulatory Visit: Payer: Self-pay

## 2022-01-04 VITALS — BP 120/70 | HR 71 | Wt 167.0 lb

## 2022-01-04 DIAGNOSIS — O2342 Unspecified infection of urinary tract in pregnancy, second trimester: Secondary | ICD-10-CM

## 2022-01-04 DIAGNOSIS — Z8632 Personal history of gestational diabetes: Secondary | ICD-10-CM

## 2022-01-04 DIAGNOSIS — Z3492 Encounter for supervision of normal pregnancy, unspecified, second trimester: Secondary | ICD-10-CM

## 2022-01-04 DIAGNOSIS — O2341 Unspecified infection of urinary tract in pregnancy, first trimester: Secondary | ICD-10-CM

## 2022-01-04 DIAGNOSIS — O09899 Supervision of other high risk pregnancies, unspecified trimester: Secondary | ICD-10-CM

## 2022-01-04 DIAGNOSIS — Z3491 Encounter for supervision of normal pregnancy, unspecified, first trimester: Secondary | ICD-10-CM

## 2022-01-04 DIAGNOSIS — O09892 Supervision of other high risk pregnancies, second trimester: Secondary | ICD-10-CM

## 2022-01-04 DIAGNOSIS — Z3A16 16 weeks gestation of pregnancy: Secondary | ICD-10-CM

## 2022-01-04 NOTE — Progress Notes (Signed)
   PRENATAL VISIT NOTE  Subjective:  Amber Alvarez is a 21 y.o. G2P1001 at [redacted]w[redacted]d being seen today for ongoing prenatal care.  She is currently monitored for the following issues for this high-risk pregnancy and has Language barrier; History of cholelithiasis; History of gestational diabetes; Supervision of low-risk pregnancy, first trimester; Short interval between pregnancies affecting pregnancy, antepartum; and UTI (urinary tract infection) during pregnancy, first trimester on their problem list.  Patient reports no complaints.  Contractions: Not present. Vag. Bleeding: None.  Movement: Absent. Denies leaking of fluid.   The following portions of the patient's history were reviewed and updated as appropriate: allergies, current medications, past family history, past medical history, past social history, past surgical history and problem list.   Objective:   Vitals:   01/04/22 1513  BP: 120/70  Pulse: 71  Weight: 167 lb (75.8 kg)    Fetal Status: Fetal Heart Rate (bpm): 145   Movement: Absent     General:  Alert, oriented and cooperative. Patient is in no acute distress.  Skin: Skin is warm and dry. No rash noted.   Cardiovascular: Normal heart rate noted  Respiratory: Normal respiratory effort, no problems with respiration noted  Abdomen: Soft, gravid, appropriate for gestational age.  Pain/Pressure: Present     Pelvic: Cervical exam deferred        Extremities: Normal range of motion.     Mental Status: Normal mood and affect. Normal behavior. Normal judgment and thought content.   Assessment and Plan:  Pregnancy: G2P1001 at [redacted]w[redacted]d 1. History of gestational diabetes Will come next week for early screening - Glucose Tolerance, 2 Hours w/1 Hour; Future  2. Short interval between pregnancies affecting pregnancy, antepartum  3. Supervision of low-risk pregnancy, first trimester Up to date FH appropriate ~16  4. UTI (urinary tract infection) during pregnancy, first trimester -  Culture, OB Urine   Preterm labor symptoms and general obstetric precautions including but not limited to vaginal bleeding, contractions, leaking of fluid and fetal movement were reviewed in detail with the patient. Please refer to After Visit Summary for other counseling recommendations.   Return in about 4 weeks (around 02/01/2022) for Routine prenatal care.  Future Appointments  Date Time Provider Department Center  01/26/2022  2:30 PM [facility] US2 [facility]    Federico Flake, MD

## 2022-01-06 LAB — URINE CULTURE, OB REFLEX

## 2022-01-06 LAB — CULTURE, OB URINE

## 2022-01-12 ENCOUNTER — Other Ambulatory Visit: Payer: Self-pay

## 2022-01-12 ENCOUNTER — Other Ambulatory Visit: Payer: No Typology Code available for payment source

## 2022-01-12 DIAGNOSIS — Z8632 Personal history of gestational diabetes: Secondary | ICD-10-CM

## 2022-01-13 LAB — GLUCOSE TOLERANCE, 2 HOURS W/ 1HR
Glucose, 1 hour: 128 mg/dL (ref 70–179)
Glucose, 2 hour: 112 mg/dL (ref 70–152)
Glucose, Fasting: 80 mg/dL (ref 70–91)

## 2022-01-16 ENCOUNTER — Encounter: Payer: Self-pay | Admitting: *Deleted

## 2022-01-26 ENCOUNTER — Encounter: Payer: Self-pay | Admitting: Obstetrics & Gynecology

## 2022-01-26 ENCOUNTER — Ambulatory Visit: Payer: No Typology Code available for payment source | Attending: Obstetrics & Gynecology

## 2022-01-26 ENCOUNTER — Other Ambulatory Visit: Payer: Self-pay | Admitting: *Deleted

## 2022-01-26 ENCOUNTER — Other Ambulatory Visit: Payer: Self-pay | Admitting: Obstetrics & Gynecology

## 2022-01-26 DIAGNOSIS — O4400 Placenta previa specified as without hemorrhage, unspecified trimester: Secondary | ICD-10-CM | POA: Insufficient documentation

## 2022-01-26 DIAGNOSIS — O3662X0 Maternal care for excessive fetal growth, second trimester, not applicable or unspecified: Secondary | ICD-10-CM | POA: Insufficient documentation

## 2022-01-26 DIAGNOSIS — O09292 Supervision of pregnancy with other poor reproductive or obstetric history, second trimester: Secondary | ICD-10-CM | POA: Insufficient documentation

## 2022-01-26 DIAGNOSIS — O4412 Placenta previa with hemorrhage, second trimester: Secondary | ICD-10-CM | POA: Insufficient documentation

## 2022-01-26 DIAGNOSIS — O2341 Unspecified infection of urinary tract in pregnancy, first trimester: Secondary | ICD-10-CM

## 2022-01-26 DIAGNOSIS — O24112 Pre-existing diabetes mellitus, type 2, in pregnancy, second trimester: Secondary | ICD-10-CM | POA: Insufficient documentation

## 2022-01-26 DIAGNOSIS — Z3A11 11 weeks gestation of pregnancy: Secondary | ICD-10-CM

## 2022-01-26 DIAGNOSIS — O09899 Supervision of other high risk pregnancies, unspecified trimester: Secondary | ICD-10-CM

## 2022-01-26 DIAGNOSIS — Z8632 Personal history of gestational diabetes: Secondary | ICD-10-CM

## 2022-01-26 DIAGNOSIS — Z3491 Encounter for supervision of normal pregnancy, unspecified, first trimester: Secondary | ICD-10-CM | POA: Diagnosis present

## 2022-01-26 DIAGNOSIS — Z3A19 19 weeks gestation of pregnancy: Secondary | ICD-10-CM | POA: Diagnosis not present

## 2022-01-26 DIAGNOSIS — O4402 Placenta previa specified as without hemorrhage, second trimester: Secondary | ICD-10-CM

## 2022-01-26 DIAGNOSIS — O4440 Low lying placenta NOS or without hemorrhage, unspecified trimester: Secondary | ICD-10-CM | POA: Insufficient documentation

## 2022-01-26 DIAGNOSIS — O321XX0 Maternal care for breech presentation, not applicable or unspecified: Secondary | ICD-10-CM | POA: Diagnosis not present

## 2022-01-26 DIAGNOSIS — Z362 Encounter for other antenatal screening follow-up: Secondary | ICD-10-CM

## 2022-01-26 DIAGNOSIS — O09299 Supervision of pregnancy with other poor reproductive or obstetric history, unspecified trimester: Secondary | ICD-10-CM

## 2022-01-26 DIAGNOSIS — Z363 Encounter for antenatal screening for malformations: Secondary | ICD-10-CM | POA: Insufficient documentation

## 2022-02-09 ENCOUNTER — Encounter: Payer: Self-pay | Admitting: Family Medicine

## 2022-02-13 ENCOUNTER — Other Ambulatory Visit: Payer: Self-pay

## 2022-02-13 ENCOUNTER — Ambulatory Visit (INDEPENDENT_AMBULATORY_CARE_PROVIDER_SITE_OTHER): Payer: 59 | Admitting: Advanced Practice Midwife

## 2022-02-13 VITALS — BP 113/63 | HR 76 | Wt 170.0 lb

## 2022-02-13 DIAGNOSIS — Z3402 Encounter for supervision of normal first pregnancy, second trimester: Secondary | ICD-10-CM

## 2022-02-13 DIAGNOSIS — O26892 Other specified pregnancy related conditions, second trimester: Secondary | ICD-10-CM

## 2022-02-13 DIAGNOSIS — O26899 Other specified pregnancy related conditions, unspecified trimester: Secondary | ICD-10-CM

## 2022-02-13 DIAGNOSIS — K5900 Constipation, unspecified: Secondary | ICD-10-CM

## 2022-02-13 DIAGNOSIS — R102 Pelvic and perineal pain: Secondary | ICD-10-CM

## 2022-02-13 DIAGNOSIS — O99612 Diseases of the digestive system complicating pregnancy, second trimester: Secondary | ICD-10-CM

## 2022-02-13 DIAGNOSIS — Z3A21 21 weeks gestation of pregnancy: Secondary | ICD-10-CM

## 2022-02-13 MED ORDER — POLYETHYLENE GLYCOL 3350 17 GM/SCOOP PO POWD: 17.0000 g | Freq: Every day | ORAL | 0 refills | Status: DC

## 2022-02-13 MED ORDER — DOCUSATE SODIUM 100 MG PO CAPS: 100.0000 mg | ORAL_CAPSULE | Freq: Two times a day (BID) | ORAL | 0 refills | Status: DC

## 2022-02-13 NOTE — Progress Notes (Signed)
   PRENATAL VISIT NOTE  Subjective:  Amber Alvarez is a 22 y.o. G2P1001 at [redacted]w[redacted]d being seen today for ongoing prenatal care.  She is currently monitored for the following issues for this low-risk pregnancy and has Language barrier; History of cholelithiasis; History of gestational diabetes; Supervision of low-risk pregnancy, first trimester; Short interval between pregnancies affecting pregnancy, antepartum; UTI (urinary tract infection) during pregnancy, first trimester; and Placenta previa, posterior on their problem list.  Patient reports  constipation, and bilateral lower abdominal pain with position change .  Contractions: Not present. Vag. Bleeding: None.  Movement: Present. Denies leaking of fluid.   The following portions of the patient's history were reviewed and updated as appropriate: allergies, current medications, past family history, past medical history, past social history, past surgical history and problem list.   Objective:   Vitals:   02/13/22 1542  BP: 113/63  Pulse: 76  Weight: 170 lb (77.1 kg)    Fetal Status: Fetal Heart Rate (bpm): 138   Movement: Present     General:  Alert, oriented and cooperative. Patient is in no acute distress.  Skin: Skin is warm and dry. No rash noted.   Cardiovascular: Normal heart rate noted  Respiratory: Normal respiratory effort, no problems with respiration noted  Abdomen: Soft, gravid, appropriate for gestational age.  Pain/Pressure: Present     Pelvic: Cervical exam deferred        Extremities: Normal range of motion.  Edema: None  Mental Status: Normal mood and affect. Normal behavior. Normal judgment and thought content.   Assessment and Plan:  Pregnancy: G2P1001 at [redacted]w[redacted]d 1. Encounter for supervision of normal first pregnancy in second trimester --Anticipatory guidance about next visits/weeks of pregnancy given.   2. [redacted] weeks gestation of pregnancy   3. Constipation during pregnancy in second trimester --Pt encouraged to  drink more water. She reports her urine is dark and she has felt better after IV fluids given in MAU.  Pt to drink water to keep urine light in color.  --Eat high fiber foods, reviewed food choices - docusate sodium (COLACE) 100 MG capsule; Take 1 capsule (100 mg total) by mouth 2 (two) times daily.  Dispense: 10 capsule; Refill: 0 - polyethylene glycol powder (GLYCOLAX/MIRALAX) 17 GM/SCOOP powder; Take 17 g by mouth daily.  Dispense: 255 g; Refill: 0  4. Pain of round ligament affecting pregnancy, antepartum --Rest/ice/heat/warm bath/increase PO fluids/Tylenol/pregnancy support belt    Preterm labor symptoms and general obstetric precautions including but not limited to vaginal bleeding, contractions, leaking of fluid and fetal movement were reviewed in detail with the patient. Please refer to After Visit Summary for other counseling recommendations.   No follow-ups on file.  Future Appointments  Date Time Provider [place]  02/23/2022  2:15 PM [facility] NURSE [facility]  02/23/2022  2:30 PM [facility] US2 [facility]    Fatima Blank, CNM

## 2022-02-23 ENCOUNTER — Encounter: Payer: Self-pay | Admitting: *Deleted

## 2022-02-23 ENCOUNTER — Ambulatory Visit: Payer: 59 | Admitting: *Deleted

## 2022-02-23 ENCOUNTER — Ambulatory Visit: Payer: 59 | Attending: Maternal & Fetal Medicine

## 2022-02-23 VITALS — BP 118/60 | HR 85

## 2022-02-23 DIAGNOSIS — O3662X0 Maternal care for excessive fetal growth, second trimester, not applicable or unspecified: Secondary | ICD-10-CM | POA: Diagnosis not present

## 2022-02-23 DIAGNOSIS — O24112 Pre-existing diabetes mellitus, type 2, in pregnancy, second trimester: Secondary | ICD-10-CM | POA: Insufficient documentation

## 2022-02-23 DIAGNOSIS — Z8632 Personal history of gestational diabetes: Secondary | ICD-10-CM

## 2022-02-23 DIAGNOSIS — O4402 Placenta previa specified as without hemorrhage, second trimester: Secondary | ICD-10-CM

## 2022-02-23 DIAGNOSIS — O09292 Supervision of pregnancy with other poor reproductive or obstetric history, second trimester: Secondary | ICD-10-CM | POA: Diagnosis not present

## 2022-02-23 DIAGNOSIS — O2341 Unspecified infection of urinary tract in pregnancy, first trimester: Secondary | ICD-10-CM

## 2022-02-23 DIAGNOSIS — Z362 Encounter for other antenatal screening follow-up: Secondary | ICD-10-CM | POA: Diagnosis not present

## 2022-02-23 DIAGNOSIS — Z3A23 23 weeks gestation of pregnancy: Secondary | ICD-10-CM | POA: Diagnosis not present

## 2022-02-23 DIAGNOSIS — Z3491 Encounter for supervision of normal pregnancy, unspecified, first trimester: Secondary | ICD-10-CM | POA: Insufficient documentation

## 2022-02-23 DIAGNOSIS — O4412 Placenta previa with hemorrhage, second trimester: Secondary | ICD-10-CM | POA: Insufficient documentation

## 2022-02-23 DIAGNOSIS — O09899 Supervision of other high risk pregnancies, unspecified trimester: Secondary | ICD-10-CM

## 2022-02-23 DIAGNOSIS — O09299 Supervision of pregnancy with other poor reproductive or obstetric history, unspecified trimester: Secondary | ICD-10-CM | POA: Diagnosis present

## 2022-03-14 ENCOUNTER — Encounter: Payer: 59 | Admitting: Certified Nurse Midwife

## 2022-03-22 ENCOUNTER — Other Ambulatory Visit: Payer: Self-pay

## 2022-03-22 ENCOUNTER — Ambulatory Visit (INDEPENDENT_AMBULATORY_CARE_PROVIDER_SITE_OTHER): Payer: 59 | Admitting: Obstetrics and Gynecology

## 2022-03-22 VITALS — BP 124/73 | HR 89 | Wt 185.1 lb

## 2022-03-22 DIAGNOSIS — Z23 Encounter for immunization: Secondary | ICD-10-CM

## 2022-03-22 DIAGNOSIS — O3663X0 Maternal care for excessive fetal growth, third trimester, not applicable or unspecified: Secondary | ICD-10-CM

## 2022-03-22 DIAGNOSIS — Z789 Other specified health status: Secondary | ICD-10-CM

## 2022-03-22 DIAGNOSIS — Z3491 Encounter for supervision of normal pregnancy, unspecified, first trimester: Secondary | ICD-10-CM

## 2022-03-22 DIAGNOSIS — O3660X0 Maternal care for excessive fetal growth, unspecified trimester, not applicable or unspecified: Secondary | ICD-10-CM

## 2022-03-22 DIAGNOSIS — O09293 Supervision of pregnancy with other poor reproductive or obstetric history, third trimester: Secondary | ICD-10-CM

## 2022-03-22 DIAGNOSIS — Z8632 Personal history of gestational diabetes: Secondary | ICD-10-CM

## 2022-03-22 DIAGNOSIS — O09892 Supervision of other high risk pregnancies, second trimester: Secondary | ICD-10-CM

## 2022-03-22 DIAGNOSIS — Z3A27 27 weeks gestation of pregnancy: Secondary | ICD-10-CM

## 2022-03-22 DIAGNOSIS — O2342 Unspecified infection of urinary tract in pregnancy, second trimester: Secondary | ICD-10-CM

## 2022-03-22 DIAGNOSIS — O0992 Supervision of high risk pregnancy, unspecified, second trimester: Secondary | ICD-10-CM

## 2022-03-22 DIAGNOSIS — O09899 Supervision of other high risk pregnancies, unspecified trimester: Secondary | ICD-10-CM

## 2022-03-22 DIAGNOSIS — O0990 Supervision of high risk pregnancy, unspecified, unspecified trimester: Secondary | ICD-10-CM

## 2022-03-22 DIAGNOSIS — O09299 Supervision of pregnancy with other poor reproductive or obstetric history, unspecified trimester: Secondary | ICD-10-CM

## 2022-03-22 DIAGNOSIS — O4440 Low lying placenta NOS or without hemorrhage, unspecified trimester: Secondary | ICD-10-CM

## 2022-03-22 DIAGNOSIS — O4442 Low lying placenta NOS or without hemorrhage, second trimester: Secondary | ICD-10-CM

## 2022-03-22 DIAGNOSIS — O2341 Unspecified infection of urinary tract in pregnancy, first trimester: Secondary | ICD-10-CM

## 2022-03-22 MED ORDER — MONTELUKAST SODIUM 10 MG PO TABS: 10.0000 mg | ORAL_TABLET | Freq: Every day | ORAL | 1 refills | Status: DC

## 2022-03-23 DIAGNOSIS — O09299 Supervision of pregnancy with other poor reproductive or obstetric history, unspecified trimester: Secondary | ICD-10-CM | POA: Insufficient documentation

## 2022-03-23 DIAGNOSIS — O3660X0 Maternal care for excessive fetal growth, unspecified trimester, not applicable or unspecified: Secondary | ICD-10-CM | POA: Insufficient documentation

## 2022-03-23 NOTE — Progress Notes (Signed)
   PRENATAL VISIT NOTE  Subjective:  Amber Alvarez is a 22 y.o. G2P1001 at 42w0dbeing seen today for ongoing prenatal care.  She is currently monitored for the following issues for this high-risk pregnancy and has Excessive fetal growth affecting management of mother, antepartum; Language barrier; History of cholelithiasis; History of gestational diabetes; Supervision of high risk pregnancy, antepartum; Short interval between pregnancies affecting pregnancy, antepartum; UTI (urinary tract infection) during pregnancy, first trimester; and Low lying placenta, antepartum on their problem list.  Patient reports occasional SOB but no other s/s.  Contractions: Not present. Vag. Bleeding: None.  Movement: Present. Denies leaking of fluid.   The following portions of the patient's history were reviewed and updated as appropriate: allergies, current medications, past family history, past medical history, past social history, past surgical history and problem list.   Objective:   Vitals:   03/22/22 1602  BP: 124/73  Pulse: 89  Weight: 185 lb 1.6 oz (84 kg)    Fetal Status: Fetal Heart Rate (bpm): 141   Movement: Present     General:  Alert, oriented and cooperative. Patient is in no acute distress.  Skin: Skin is warm and dry. No rash noted.   Cardiovascular: Normal heart rate noted  Respiratory: Normal respiratory effort, no problems with respiration noted  Abdomen: Soft, gravid, appropriate for gestational age.  Pain/Pressure: Absent     Pelvic: Cervical exam deferred        Extremities: Normal range of motion.  Edema: None  Mental Status: Normal mood and affect. Normal behavior. Normal judgment and thought content.   Assessment and Plan:  Pregnancy: G2P1001 at 258w0d. Supervision of low-risk pregnancy - Tdap vaccine greater than or equal to 7yo IM  2. [redacted] weeks gestation of pregnancy 28wk labs next visit  3. UTI (urinary tract infection) during pregnancy, first trimester Toc neg  4.  Supervision of high risk pregnancy, antepartum  5. Short interval between pregnancies affecting pregnancy, antepartum See below  6. Low lying placenta, antepartum Was previa but now posterior low lying (within 44m101mrom os) on 1/25. EFW 94%, 699gm, ac 94%, afi wnl.  I d/w them 2cm or greater from os is goal for labor of delivery trial and need for 32wk f/u u/s. He will look at our schedule and let us [place]  7. Language barrier Husband interprets (form already signed)  8. History of gestational diabetes F/u next nv  9. Excessive fetal growth affecting management of pregnancy, antepartum, single or unspecified fetus See above H/o macrosomia with Jan 2023 39wk SVD of 4155g IOL for GDMA1  Preterm labor symptoms and general obstetric precautions including but not limited to vaginal bleeding, contractions, leaking of fluid and fetal movement were reviewed in detail with the patient. Please refer to After Visit Summary for other counseling recommendations.   Return in about 13 days (around 04/04/2022) for  low risk ob, md or app, fasting 2hr GTT.  Future Appointments  Date Time Provider [place]/05/2022  8:20 AM [facility]/05/2022  8:55 AM HogTresea MallNM [facility]/19/2024  8:55 AM HogTresea MallNM [facility]/03/2022  2:35 PM NewCaren MacadamD [facility] ChaAletha HalimD

## 2022-04-04 ENCOUNTER — Encounter: Payer: Self-pay | Admitting: Advanced Practice Midwife

## 2022-04-04 ENCOUNTER — Other Ambulatory Visit: Payer: Self-pay

## 2022-04-04 ENCOUNTER — Other Ambulatory Visit: Payer: 59

## 2022-04-04 ENCOUNTER — Ambulatory Visit (INDEPENDENT_AMBULATORY_CARE_PROVIDER_SITE_OTHER): Payer: 59 | Admitting: Advanced Practice Midwife

## 2022-04-04 VITALS — BP 123/71 | HR 96 | Wt 189.3 lb

## 2022-04-04 DIAGNOSIS — O2341 Unspecified infection of urinary tract in pregnancy, first trimester: Secondary | ICD-10-CM | POA: Diagnosis not present

## 2022-04-04 DIAGNOSIS — O4443 Low lying placenta NOS or without hemorrhage, third trimester: Secondary | ICD-10-CM

## 2022-04-04 DIAGNOSIS — Z3A28 28 weeks gestation of pregnancy: Secondary | ICD-10-CM

## 2022-04-04 DIAGNOSIS — O0993 Supervision of high risk pregnancy, unspecified, third trimester: Secondary | ICD-10-CM

## 2022-04-04 DIAGNOSIS — O0990 Supervision of high risk pregnancy, unspecified, unspecified trimester: Secondary | ICD-10-CM

## 2022-04-04 DIAGNOSIS — O4440 Low lying placenta NOS or without hemorrhage, unspecified trimester: Secondary | ICD-10-CM

## 2022-04-04 DIAGNOSIS — O2343 Unspecified infection of urinary tract in pregnancy, third trimester: Secondary | ICD-10-CM

## 2022-04-04 LAB — POCT URINALYSIS DIP (DEVICE)
Bilirubin Urine: NEGATIVE
Glucose, UA: NEGATIVE mg/dL
Ketones, ur: NEGATIVE mg/dL
Leukocytes,Ua: NEGATIVE
Nitrite: NEGATIVE
Protein, ur: NEGATIVE mg/dL
Specific Gravity, Urine: 1.025 (ref 1.005–1.030)
Urobilinogen, UA: 0.2 mg/dL (ref 0.0–1.0)
pH: 6 (ref 5.0–8.0)

## 2022-04-04 NOTE — Progress Notes (Signed)
   PRENATAL VISIT NOTE  Subjective:  Amber Alvarez is a 22 y.o. G2P1001 at 7w6dbeing seen today for ongoing prenatal care.  She is currently monitored for the following issues for this high-risk pregnancy and has Excessive fetal growth affecting management of mother, antepartum; Language barrier; History of cholelithiasis; History of gestational diabetes; Supervision of high risk pregnancy, antepartum; Short interval between pregnancies affecting pregnancy, antepartum; UTI (urinary tract infection) during pregnancy, first trimester; Low lying placenta, antepartum; and History of macrosomia in infant in prior pregnancy, currently pregnant on their problem list.  Patient reports  leg cramps at night, pressure with urination .  Contractions: Not present. Vag. Bleeding: None.  Movement: Present. Denies leaking of fluid.   The following portions of the patient's history were reviewed and updated as appropriate: allergies, current medications, past family history, past medical history, past social history, past surgical history and problem list.   Objective:   Vitals:   04/04/22 0852  BP: 123/71  Pulse: 96  Weight: 189 lb 4.8 oz (85.9 kg)    Fetal Status: Fetal Heart Rate (bpm): 133 Fundal Height: 28 cm Movement: Present     General:  Alert, oriented and cooperative. Patient is in no acute distress.  Skin: Skin is warm and dry. No rash noted.   Cardiovascular: Normal heart rate noted  Respiratory: Normal respiratory effort, no problems with respiration noted  Abdomen: Soft, gravid, appropriate for gestational age.  Pain/Pressure: Present     Pelvic: Cervical exam deferred        Extremities: Normal range of motion.  Edema: Trace  Mental Status: Normal mood and affect. Normal behavior. Normal judgment and thought content.   Assessment and Plan:  Pregnancy: G2P1001 at 221w6d. Supervision of high risk pregnancy, antepartum - routine care - Will send UA for urinary pressure that patient  reports  - Leg cramps sound like charlie horse - discussed comfort measures.  - MFM recommended FU [place]ore[place]ill get this scheduled   2. [redacted] weeks gestation of pregnancy - 28 week labs and GTT today - TDAP done previously   Preterm labor symptoms and general obstetric precautions including but not limited to vaginal bleeding, contractions, leaking of fluid and fetal movement were reviewed in detail with the patient. Please refer to After Visit Summary for other counseling recommendations.   Return in about 2 weeks (around 04/18/2022).  Future Appointments  Date Time Provider DeEast Side3/19/2024  8:55 AM HoTresea MallCNM [facility]4/03/2022  2:35 PM NeCaren MacadamMD WM[facility] CareMYellow SpringsNP, CNM  04/04/22  9:03 AM

## 2022-04-04 NOTE — Addendum Note (Signed)
Addended byMariane Baumgarten on: 04/04/2022 11:55 AM   Modules accepted: Orders

## 2022-04-05 LAB — CBC
Hematocrit: 37.5 % (ref 34.0–46.6)
Hemoglobin: 12.5 g/dL (ref 11.1–15.9)
MCH: 31 pg (ref 26.6–33.0)
MCHC: 33.3 g/dL (ref 31.5–35.7)
MCV: 93 fL (ref 79–97)
Platelets: 257 10*3/uL (ref 150–450)
RBC: 4.03 x10E6/uL (ref 3.77–5.28)
RDW: 12.7 % (ref 11.7–15.4)
WBC: 9.6 10*3/uL (ref 3.4–10.8)

## 2022-04-05 LAB — RPR: RPR Ser Ql: NONREACTIVE

## 2022-04-05 LAB — GLUCOSE TOLERANCE, 2 HOURS W/ 1HR
Glucose, 1 hour: 186 mg/dL — ABNORMAL HIGH (ref 70–179)
Glucose, 2 hour: 163 mg/dL — ABNORMAL HIGH (ref 70–152)
Glucose, Fasting: 87 mg/dL (ref 70–91)

## 2022-04-05 LAB — HIV ANTIBODY (ROUTINE TESTING W REFLEX): HIV Screen 4th Generation wRfx: NONREACTIVE

## 2022-04-08 LAB — URINE CULTURE, OB REFLEX

## 2022-04-08 LAB — CULTURE, OB URINE

## 2022-04-11 ENCOUNTER — Ambulatory Visit: Payer: 59 | Attending: Advanced Practice Midwife

## 2022-04-11 ENCOUNTER — Ambulatory Visit: Payer: 59 | Admitting: *Deleted

## 2022-04-11 ENCOUNTER — Encounter: Payer: Self-pay | Admitting: *Deleted

## 2022-04-11 VITALS — BP 116/66 | HR 76

## 2022-04-11 DIAGNOSIS — O3663X0 Maternal care for excessive fetal growth, third trimester, not applicable or unspecified: Secondary | ICD-10-CM | POA: Diagnosis not present

## 2022-04-11 DIAGNOSIS — O0990 Supervision of high risk pregnancy, unspecified, unspecified trimester: Secondary | ICD-10-CM | POA: Insufficient documentation

## 2022-04-11 DIAGNOSIS — O2341 Unspecified infection of urinary tract in pregnancy, first trimester: Secondary | ICD-10-CM

## 2022-04-11 DIAGNOSIS — Z3A29 29 weeks gestation of pregnancy: Secondary | ICD-10-CM

## 2022-04-11 DIAGNOSIS — O24419 Gestational diabetes mellitus in pregnancy, unspecified control: Secondary | ICD-10-CM

## 2022-04-11 DIAGNOSIS — O09293 Supervision of pregnancy with other poor reproductive or obstetric history, third trimester: Secondary | ICD-10-CM | POA: Diagnosis not present

## 2022-04-11 DIAGNOSIS — O4440 Low lying placenta NOS or without hemorrhage, unspecified trimester: Secondary | ICD-10-CM

## 2022-04-11 DIAGNOSIS — O4403 Placenta previa specified as without hemorrhage, third trimester: Secondary | ICD-10-CM

## 2022-04-12 ENCOUNTER — Other Ambulatory Visit: Payer: Self-pay | Admitting: *Deleted

## 2022-04-12 DIAGNOSIS — O4403 Placenta previa specified as without hemorrhage, third trimester: Secondary | ICD-10-CM

## 2022-04-12 DIAGNOSIS — O3663X0 Maternal care for excessive fetal growth, third trimester, not applicable or unspecified: Secondary | ICD-10-CM

## 2022-04-12 DIAGNOSIS — O24419 Gestational diabetes mellitus in pregnancy, unspecified control: Secondary | ICD-10-CM

## 2022-04-13 ENCOUNTER — Telehealth: Payer: Self-pay

## 2022-04-13 MED ORDER — PENICILLIN V POTASSIUM 500 MG PO TABS: 500.0000 mg | ORAL_TABLET | Freq: Three times a day (TID) | ORAL | 0 refills | Status: DC

## 2022-04-13 NOTE — Addendum Note (Signed)
Addended by: Marcille Buffy D on: 04/13/2022 08:25 AM   Modules accepted: Orders

## 2022-04-13 NOTE — Telephone Encounter (Addendum)
-----   Message from Tresea Mall, CNM sent at 04/13/2022  8:19 AM EDT ----- This patient has GDM. Please schedule her with diabetes educators.   Called pt with [employer] # [phone] and left message to contact the office.    Frances Nickels  04/13/22

## 2022-04-17 ENCOUNTER — Encounter (HOSPITAL_COMMUNITY): Payer: Self-pay | Admitting: Family Medicine

## 2022-04-17 ENCOUNTER — Inpatient Hospital Stay (HOSPITAL_COMMUNITY)
Admission: AD | Admit: 2022-04-17 | Discharge: 2022-04-17 | Disposition: A | Discharge status: Home / Self Care | Payer: 59 | Attending: Family Medicine | Admitting: Family Medicine

## 2022-04-17 DIAGNOSIS — Z87440 Personal history of urinary (tract) infections: Secondary | ICD-10-CM | POA: Insufficient documentation

## 2022-04-17 DIAGNOSIS — O4703 False labor before 37 completed weeks of gestation, third trimester: Secondary | ICD-10-CM | POA: Insufficient documentation

## 2022-04-17 DIAGNOSIS — Z3A30 30 weeks gestation of pregnancy: Secondary | ICD-10-CM | POA: Insufficient documentation

## 2022-04-17 DIAGNOSIS — Z3689 Encounter for other specified antenatal screening: Secondary | ICD-10-CM | POA: Diagnosis not present

## 2022-04-17 DIAGNOSIS — R109 Unspecified abdominal pain: Secondary | ICD-10-CM | POA: Insufficient documentation

## 2022-04-17 DIAGNOSIS — O26893 Other specified pregnancy related conditions, third trimester: Secondary | ICD-10-CM | POA: Diagnosis not present

## 2022-04-17 DIAGNOSIS — O3663X0 Maternal care for excessive fetal growth, third trimester, not applicable or unspecified: Secondary | ICD-10-CM

## 2022-04-17 DIAGNOSIS — Z8632 Personal history of gestational diabetes: Secondary | ICD-10-CM | POA: Insufficient documentation

## 2022-04-17 DIAGNOSIS — O24419 Gestational diabetes mellitus in pregnancy, unspecified control: Secondary | ICD-10-CM | POA: Insufficient documentation

## 2022-04-17 HISTORY — DX: Anxiety disorder, unspecified: F41.9

## 2022-04-17 HISTORY — DX: Anemia, unspecified: D64.9

## 2022-04-17 HISTORY — DX: Urinary tract infection, site not specified: N39.0

## 2022-04-17 LAB — COMPREHENSIVE METABOLIC PANEL
ALT: 14 U/L (ref 0–44)
AST: 20 U/L (ref 15–41)
Albumin: 2.5 g/dL — ABNORMAL LOW (ref 3.5–5.0)
Alkaline Phosphatase: 78 U/L (ref 38–126)
Anion gap: 8 (ref 5–15)
BUN: 5 mg/dL — ABNORMAL LOW (ref 6–20)
CO2: 21 mmol/L — ABNORMAL LOW (ref 22–32)
Calcium: 8.4 mg/dL — ABNORMAL LOW (ref 8.9–10.3)
Chloride: 105 mmol/L (ref 98–111)
Creatinine, Ser: 0.4 mg/dL — ABNORMAL LOW (ref 0.44–1.00)
GFR, Estimated: >60 mL/min (ref >60)
Glucose, Bld: 82 mg/dL (ref 70–99)
Potassium: 3.4 mmol/L — ABNORMAL LOW (ref 3.5–5.1)
Sodium: 134 mmol/L — ABNORMAL LOW (ref 135–145)
Total Bilirubin: 0.2 mg/dL — ABNORMAL LOW (ref 0.3–1.2)
Total Protein: 5.6 g/dL — ABNORMAL LOW (ref 6.5–8.1)

## 2022-04-17 LAB — URINALYSIS, ROUTINE W REFLEX MICROSCOPIC
Bilirubin Urine: NEGATIVE
Glucose, UA: NEGATIVE mg/dL
Hgb urine dipstick: NEGATIVE
Ketones, ur: NEGATIVE mg/dL
Leukocytes,Ua: NEGATIVE
Nitrite: NEGATIVE
Protein, ur: NEGATIVE mg/dL
Specific Gravity, Urine: 1.009 (ref 1.005–1.030)
pH: 7 (ref 5.0–8.0)

## 2022-04-17 LAB — CBC WITH DIFFERENTIAL/PLATELET
Abs Immature Granulocytes: 0.03 10*3/uL (ref 0.00–0.07)
Basophils Absolute: 0 10*3/uL (ref 0.0–0.1)
Basophils Relative: 0 %
Eosinophils Absolute: 0 10*3/uL (ref 0.0–0.5)
Eosinophils Relative: 1 %
HCT: 35.8 % — ABNORMAL LOW (ref 36.0–46.0)
Hemoglobin: 11.9 g/dL — ABNORMAL LOW (ref 12.0–15.0)
Immature Granulocytes: 0 %
Lymphocytes Relative: 25 %
Lymphs Abs: 2.1 10*3/uL (ref 0.7–4.0)
MCH: 30.7 pg (ref 26.0–34.0)
MCHC: 33.2 g/dL (ref 30.0–36.0)
MCV: 92.5 fL (ref 80.0–100.0)
Monocytes Absolute: 0.5 10*3/uL (ref 0.1–1.0)
Monocytes Relative: 6 %
Neutro Abs: 5.8 10*3/uL (ref 1.7–7.7)
Neutrophils Relative %: 68 %
Platelets: 218 10*3/uL (ref 150–400)
RBC: 3.87 MIL/uL (ref 3.87–5.11)
RDW: 13.1 % (ref 11.5–15.5)
WBC: 8.5 10*3/uL (ref 4.0–10.5)
nRBC: 0 % (ref 0.0–0.2)

## 2022-04-17 LAB — LIPASE, BLOOD: Lipase: 28 U/L (ref 11–51)

## 2022-04-17 MED ORDER — LACTATED RINGERS IV BOLUS
1000.0000 mL | Freq: Once | INTRAVENOUS | Status: AC
  Administered 2022-04-17: 1000 mL via INTRAVENOUS

## 2022-04-17 NOTE — MAU Note (Signed)
Pt prefers female providers no students/residents.  Did state if there was an emergency or she needed a c/s and only a female available- they are ok with that.  Explained the current staffing in MAU. (Female dr, female resident, female CNM come on at 1000), she would prefer to wait until 10, to be seen by the midwife.    Explained above to Dr Nehemiah Settle.

## 2022-04-17 NOTE — MAU Note (Addendum)
Amber Alvarez is a 22 y.o. at [redacted]w[redacted]d here in MAU reporting: pain in belly started on Sat.  Comes and goes.  Varies in intensity.  No sleep last night.cramping. no bleeding or leaking. Baby is moving too much and when ever he moves she has a pain.  Denies fever, diarrhea or constipation. Onset of complaint: Sat Pain score: severe Vitals:   04/17/22 0847  BP: 116/68  Pulse: 79  Resp: 16  Temp: 98.7 F (37.1 C)  SpO2: 99%     [id number] dress on, reports FM Lab orders placed from triage:    Note stating rx for  antibiotics for UTI sent to pharmacy 3/14.  Pt/husband unaware of this.  No urinary symptoms

## 2022-04-17 NOTE — MAU Provider Note (Signed)
History     CSN: [id number]  Arrival date and time: 04/17/22 [id number]   Event Date/Time   First Provider Initiated Contact with Patient 04/17/22 1010      Chief Complaint  Patient presents with   Abdominal Pain   She is a 22 y.o G2 P1001 at 30.[redacted] weeks pregnant. Presents for abdominal pain on and off for the past two days. States it is a sharp pain localized to periumbilical region. Denies n/v, constipation, diarrhea, dyspepsia . Was treated for a UTI in December , completed abx and was told she had another UTI on 3/5, but has not taking abx yet, denies urinary symptoms, pelvic pain, leaking of fluid or vaginal bleeding. Rates pain 3/10. Has tried tylenol and heat packs for pain without relief. Reports abdominal pain comes and goes a couple of times an hour and lasts about a minute. Has not gotten worse or better.   Abdominal Pain Pertinent negatives include no constipation, diarrhea, dysuria, fever, frequency, hematuria, nausea or vomiting.    OB History     Gravida  2   Para  1   Term  1   Preterm      AB      Living  1      SAB      IAB      Ectopic      Multiple  0   Live Births  1           Past Medical History:  Diagnosis Date   Anemia    Anxiety    with preg, anious about baby   Gestational diabetes    Gestational diabetes mellitus, class A1 02/25/2021   Postpartum A1C was 5.4 % at her PCPs   UTI (urinary tract infection)     Past Surgical History:  Procedure Laterality Date   NO PAST SURGERIES      Family History  Problem Relation Age of Onset   Healthy Mother    Liver disease Father        died from pneumonia    Social History   Tobacco Use   Smoking status: Never    Passive exposure: Never   Smokeless tobacco: Never  Vaping Use   Vaping Use: Never used  Substance Use Topics   Alcohol use: Never   Drug use: Never    Allergies:  Allergies  Allergen Reactions   Beef-Derived Products     Not an allergy, halal dietary  restriction   Chicken Protein     Pt cannot eat meats that are not Halal certified. This is not an allergy.    Fish-Derived Products     Pt cannot eat meats and seafood that are not Halal certified. This is not an allergy.    Pork-Derived Products     Not an allergy, halal dietary restriction   Singulair [Montelukast]     Helped breathing, but made her very dizzy    Medications Prior to Admission  Medication Sig Dispense Refill Last Dose   acetaminophen (TYLENOL) 500 MG tablet Take 1,000 mg by mouth every 8 (eight) hours as needed. Helps HA      aspirin EC 81 MG tablet Take 1 tablet (81 mg total) by mouth daily. Take after 12 weeks for prevention of preeclampsia later in pregnancy 300 tablet 2 04/16/2022   ondansetron (ZOFRAN-ODT) 8 MG disintegrating tablet Take 1 tablet (8 mg total) by mouth every 8 (eight) hours as needed for nausea or vomiting. 30 tablet 2 04/16/2022  Prenatal Vit-Fe Fumarate-FA (PRENATAL VITAMIN PO) Take 1 tablet by mouth daily.   04/16/2022   montelukast (SINGULAIR) 10 MG tablet Take 1 tablet (10 mg total) by mouth at bedtime. 60 tablet 1    penicillin v potassium (VEETID) 500 MG tablet Take 1 tablet (500 mg total) by mouth 3 (three) times daily. 21 tablet 0     Review of Systems  Constitutional: Negative.  Negative for appetite change, chills, fatigue and fever.  HENT: Negative.    Respiratory: Negative.    Cardiovascular: Negative.   Gastrointestinal:  Positive for abdominal pain. Negative for blood in stool, constipation, diarrhea, nausea and vomiting.       Periumbilical  Endocrine: Negative.   Genitourinary: Negative.  Negative for dysuria, flank pain, frequency, hematuria, pelvic pain, urgency, vaginal bleeding, vaginal discharge and vaginal pain.  Musculoskeletal: Negative.   Skin: Negative.   Allergic/Immunologic: Negative.   Neurological: Negative.   Hematological: Negative.   Psychiatric/Behavioral: Negative.     Physical Exam   Blood pressure  115/73, pulse 83, temperature 98.7 F (37.1 C), temperature source Oral, resp. rate 16, height 5\' 6"  (1.676 m), weight 86.9 kg, last menstrual period 05/22/2020, SpO2 100 %, unknown if currently breastfeeding.  Physical Exam Vitals and nursing note reviewed. Exam conducted with a chaperone present.  Constitutional:      Appearance: She is well-developed.  Cardiovascular:     Rate and Rhythm: Normal rate and regular rhythm.  Pulmonary:     Effort: Pulmonary effort is normal.  Abdominal:     Palpations: Abdomen is soft.     Tenderness: There is abdominal tenderness in the periumbilical area.     Hernia: No hernia is present.  Genitourinary:    Comments: SVE closed/thick/-3 Skin:    General: Skin is warm and dry.  Neurological:     General: No focal deficit present.     Mental Status: She is alert.  Psychiatric:        Mood and Affect: Mood normal.        Behavior: Behavior normal.    FHR: 130s Variability: Moderate Accels: 15x15 Decels: none  Ctx: irreg with UI  Results for orders placed or performed during the hospital encounter of 04/17/22 (from the past 24 hour(s))  CBC with Differential/Platelet     Status: Abnormal   Collection Time: 04/17/22 10:04 AM  Result Value Ref Range   WBC 8.5 4.0 - 10.5 K/uL   RBC 3.87 3.87 - 5.11 MIL/uL   Hemoglobin 11.9 (L) 12.0 - 15.0 g/dL   HCT 35.8 (L) 36.0 - 46.0 %   MCV 92.5 80.0 - 100.0 fL   MCH 30.7 26.0 - 34.0 pg   MCHC 33.2 30.0 - 36.0 g/dL   RDW 13.1 11.5 - 15.5 %   Platelets 218 150 - 400 K/uL   nRBC 0.0 0.0 - 0.2 %   Neutrophils Relative % 68 %   Neutro Abs 5.8 1.7 - 7.7 K/uL   Lymphocytes Relative 25 %   Lymphs Abs 2.1 0.7 - 4.0 K/uL   Monocytes Relative 6 %   Monocytes Absolute 0.5 0.1 - 1.0 K/uL   Eosinophils Relative 1 %   Eosinophils Absolute 0.0 0.0 - 0.5 K/uL   Basophils Relative 0 %   Basophils Absolute 0.0 0.0 - 0.1 K/uL   Immature Granulocytes 0 %   Abs Immature Granulocytes 0.03 0.00 - 0.07 K/uL   Comprehensive metabolic panel     Status: Abnormal   Collection Time: 04/17/22 10:04  AM  Result Value Ref Range   Sodium 134 (L) 135 - 145 mmol/L   Potassium 3.4 (L) 3.5 - 5.1 mmol/L   Chloride 105 98 - 111 mmol/L   CO2 21 (L) 22 - 32 mmol/L   Glucose, Bld 82 70 - 99 mg/dL   BUN 5 (L) 6 - 20 mg/dL   Creatinine, Ser 0.40 (L) 0.44 - 1.00 mg/dL   Calcium 8.4 (L) 8.9 - 10.3 mg/dL   Total Protein 5.6 (L) 6.5 - 8.1 g/dL   Albumin 2.5 (L) 3.5 - 5.0 g/dL   AST 20 15 - 41 U/L   ALT 14 0 - 44 U/L   Alkaline Phosphatase 78 38 - 126 U/L   Total Bilirubin 0.2 (L) 0.3 - 1.2 mg/dL   GFR, Estimated >60 >60 mL/min   Anion gap 8 5 - 15  Lipase, blood     Status: None   Collection Time: 04/17/22 10:04 AM  Result Value Ref Range   Lipase 28 11 - 51 U/L  Urinalysis, Routine w reflex microscopic -Urine, Clean Catch     Status: None   Collection Time: 04/17/22 10:53 AM  Result Value Ref Range   Color, Urine YELLOW YELLOW   APPearance CLEAR CLEAR   Specific Gravity, Urine 1.009 1.005 - 1.030   pH 7.0 5.0 - 8.0   Glucose, UA NEGATIVE NEGATIVE mg/dL   Hgb urine dipstick NEGATIVE NEGATIVE   Bilirubin Urine NEGATIVE NEGATIVE   Ketones, ur NEGATIVE NEGATIVE mg/dL   Protein, ur NEGATIVE NEGATIVE mg/dL   Nitrite NEGATIVE NEGATIVE   Leukocytes,Ua NEGATIVE NEGATIVE    MAU Course  Procedures LR x 2L  MDM Chart reviewed, pregnancy complicated by excessive fetal growth, GDM, low lying placenta, UTI, short interval between pregnancies, hx of cholelithiasis   Low suspicion for preterm labor, symptoms improved after fluids. Reviewed results, no concern  Assessment and Plan   1. [redacted] weeks gestation of pregnancy   2. NST (non-stress test) reactive   3. Threatened preterm labor, third trimester     Discharge to home Follow [facility] at [facility] as scheduled PTL precautions  Allergies as of 04/17/2022       Reactions   Beef-derived Products    Not an allergy, halal dietary restriction   Chicken Protein    Pt  cannot eat meats that are not Halal certified. This is not an allergy.    Fish-derived Products    Pt cannot eat meats and seafood that are not Halal certified. This is not an allergy.    Pork-derived Products    Not an allergy, halal dietary restriction   Singulair [montelukast]    Helped breathing, but made her very dizzy        Medication List     STOP taking these medications    penicillin v potassium 500 MG tablet Commonly known as: VEETID       TAKE these medications    acetaminophen 500 MG tablet Commonly known as: TYLENOL Take 1,000 mg by mouth every 8 (eight) hours as needed. Helps HA   aspirin EC 81 MG tablet Take 1 tablet (81 mg total) by mouth daily. Take after 12 weeks for prevention of preeclampsia later in pregnancy   montelukast 10 MG tablet Commonly known as: Singulair Take 1 tablet (10 mg total) by mouth at bedtime.   ondansetron 8 MG disintegrating tablet Commonly known as: ZOFRAN-ODT Take 1 tablet (8 mg total) by mouth every 8 (eight) hours as needed for nausea or  vomiting.   PRENATAL VITAMIN PO Take 1 tablet by mouth daily.        Jeanann Lewandowsky, FNP-C 04/17/2022, 12:33 PM

## 2022-04-18 ENCOUNTER — Encounter: Payer: 59 | Admitting: Advanced Practice Midwife

## 2022-04-18 NOTE — Telephone Encounter (Signed)
Attempted to contact pt no interpreter available. Will try again.    Amber Alvarez

## 2022-04-19 ENCOUNTER — Encounter: Payer: Self-pay | Admitting: *Deleted

## 2022-04-19 NOTE — Telephone Encounter (Signed)
I called patient with [facility] [phone] and left a message we are calling with results and to schedule an appointment, please call our office. Since this is second time we have not reached her I will send a letter per protocol. I ave also placed a note on her next ob appt note. Staci Acosta

## 2022-05-03 ENCOUNTER — Encounter: Payer: 59 | Admitting: Family Medicine

## 2022-05-09 ENCOUNTER — Encounter: Payer: Self-pay | Admitting: *Deleted

## 2022-05-09 ENCOUNTER — Ambulatory Visit: Payer: 59 | Admitting: *Deleted

## 2022-05-09 ENCOUNTER — Ambulatory Visit: Payer: 59 | Attending: Obstetrics and Gynecology

## 2022-05-09 VITALS — BP 112/80 | HR 77

## 2022-05-09 DIAGNOSIS — O2341 Unspecified infection of urinary tract in pregnancy, first trimester: Secondary | ICD-10-CM | POA: Diagnosis not present

## 2022-05-09 DIAGNOSIS — Z3A33 33 weeks gestation of pregnancy: Secondary | ICD-10-CM | POA: Diagnosis not present

## 2022-05-09 DIAGNOSIS — O24410 Gestational diabetes mellitus in pregnancy, diet controlled: Secondary | ICD-10-CM | POA: Diagnosis not present

## 2022-05-09 DIAGNOSIS — O09293 Supervision of pregnancy with other poor reproductive or obstetric history, third trimester: Secondary | ICD-10-CM | POA: Diagnosis not present

## 2022-05-09 DIAGNOSIS — O0990 Supervision of high risk pregnancy, unspecified, unspecified trimester: Secondary | ICD-10-CM | POA: Insufficient documentation

## 2022-05-09 DIAGNOSIS — O4403 Placenta previa specified as without hemorrhage, third trimester: Secondary | ICD-10-CM

## 2022-05-09 DIAGNOSIS — O24419 Gestational diabetes mellitus in pregnancy, unspecified control: Secondary | ICD-10-CM | POA: Insufficient documentation

## 2022-05-09 DIAGNOSIS — O3663X0 Maternal care for excessive fetal growth, third trimester, not applicable or unspecified: Secondary | ICD-10-CM | POA: Diagnosis not present

## 2022-05-15 ENCOUNTER — Encounter: Payer: 59 | Admitting: Advanced Practice Midwife

## 2022-05-16 ENCOUNTER — Inpatient Hospital Stay (HOSPITAL_COMMUNITY)
Admission: AD | Admit: 2022-05-16 | Discharge: 2022-05-16 | DRG: 833 | Discharge status: Against Medical Advice | Payer: 59 | Attending: Obstetrics and Gynecology | Admitting: Obstetrics and Gynecology

## 2022-05-16 ENCOUNTER — Encounter (HOSPITAL_COMMUNITY): Payer: Self-pay | Admitting: Obstetrics and Gynecology

## 2022-05-16 DIAGNOSIS — Z3A34 34 weeks gestation of pregnancy: Secondary | ICD-10-CM

## 2022-05-16 DIAGNOSIS — Z5329 Procedure and treatment not carried out because of patient's decision for other reasons: Secondary | ICD-10-CM | POA: Diagnosis not present

## 2022-05-16 DIAGNOSIS — Z8632 Personal history of gestational diabetes: Secondary | ICD-10-CM | POA: Diagnosis not present

## 2022-05-16 DIAGNOSIS — Z79899 Other long term (current) drug therapy: Secondary | ICD-10-CM | POA: Diagnosis not present

## 2022-05-16 DIAGNOSIS — O4413 Placenta previa with hemorrhage, third trimester: Secondary | ICD-10-CM | POA: Diagnosis present

## 2022-05-16 DIAGNOSIS — O4403 Placenta previa specified as without hemorrhage, third trimester: Secondary | ICD-10-CM | POA: Diagnosis not present

## 2022-05-16 DIAGNOSIS — O4400 Placenta previa specified as without hemorrhage, unspecified trimester: Secondary | ICD-10-CM | POA: Diagnosis present

## 2022-05-16 LAB — URINALYSIS, ROUTINE W REFLEX MICROSCOPIC
Bilirubin Urine: NEGATIVE
Glucose, UA: NEGATIVE mg/dL
Hgb urine dipstick: NEGATIVE
Ketones, ur: NEGATIVE mg/dL
Leukocytes,Ua: NEGATIVE
Nitrite: NEGATIVE
Protein, ur: NEGATIVE mg/dL
Specific Gravity, Urine: 1.011 (ref 1.005–1.030)
pH: 5 (ref 5.0–8.0)

## 2022-05-16 MED ORDER — LACTATED RINGERS IV SOLN: INTRAVENOUS | Status: DC

## 2022-05-16 MED ORDER — DOCUSATE SODIUM 100 MG PO CAPS: 100.0000 mg | ORAL_CAPSULE | Freq: Every day | ORAL | Status: DC

## 2022-05-16 MED ORDER — ACETAMINOPHEN 325 MG PO TABS: 650.0000 mg | ORAL_TABLET | ORAL | Status: DC | PRN

## 2022-05-16 MED ORDER — PRENATAL MULTIVITAMIN CH: 1.0000 | ORAL_TABLET | Freq: Every day | ORAL | Status: DC

## 2022-05-16 MED ORDER — ZOLPIDEM TARTRATE 5 MG PO TABS: 5.0000 mg | ORAL_TABLET | Freq: Every evening | ORAL | Status: DC | PRN

## 2022-05-16 MED ORDER — CALCIUM CARBONATE ANTACID 500 MG PO CHEW: 2.0000 | CHEWABLE_TABLET | ORAL | Status: DC | PRN

## 2022-05-16 NOTE — Progress Notes (Signed)
Pt requesting to leave AMA. Risks of leaving explained to pt and significant other. Both verbalized understanding. AMA form signed by pt and RN.

## 2022-05-16 NOTE — MAU Provider Note (Signed)
History     CSN: [id number]  Arrival date and time: 05/16/22 2109   Event Date/Time   First Provider Initiated Contact with Patient 05/16/22 2147      Chief Complaint  Patient presents with   Vaginal Bleeding   HPI  Amber Alvarez is a 22 y.o. G2P1001 at [redacted]w[redacted]d who presents for evaluation of vaginal bleeding. Patient reports she has been bleeding since yesterday. She reports yesterday it was heavier like a period but today it is only when she wipes. She reports she did not come in yesterday because she was hoping it would stop but it didn't. She has a known previa.   She denies any pain. She denies any discharge and leaking of fluid. Denies any constipation, diarrhea or any urinary complaints. Reports normal fetal movement.   OB History     Gravida  2   Para  1   Term  1   Preterm      AB      Living  1      SAB      IAB      Ectopic      Multiple  0   Live Births  1           Past Medical History:  Diagnosis Date   Anemia    Anxiety    with preg, anious about baby   Gestational diabetes    Gestational diabetes mellitus, class A1 02/25/2021   Postpartum A1C was 5.4 % at her PCPs   UTI (urinary tract infection)     Past Surgical History:  Procedure Laterality Date   NO PAST SURGERIES      Family History  Problem Relation Age of Onset   Healthy Mother    Liver disease Father        died from pneumonia    Social History   Tobacco Use   Smoking status: Never    Passive exposure: Never   Smokeless tobacco: Never  Vaping Use   Vaping Use: Never used  Substance Use Topics   Alcohol use: Never   Drug use: Never    Allergies:  Allergies  Allergen Reactions   Beef-Derived Products     Not an allergy, halal dietary restriction   Chicken Protein     Pt cannot eat meats that are not Halal certified. This is not an allergy.    Fish-Derived Products     Pt cannot eat meats and seafood that are not Halal certified. This is not an allergy.     Pork-Derived Products     Not an allergy, halal dietary restriction   Singulair [Montelukast]     Helped breathing, but made her very dizzy    Medications Prior to Admission  Medication Sig Dispense Refill Last Dose   aspirin EC 81 MG tablet Take 1 tablet (81 mg total) by mouth daily. Take after 12 weeks for prevention of preeclampsia later in pregnancy 300 tablet 2 05/16/2022   ondansetron (ZOFRAN-ODT) 8 MG disintegrating tablet Take 1 tablet (8 mg total) by mouth every 8 (eight) hours as needed for nausea or vomiting. 30 tablet 2 05/16/2022   Prenatal Vit-Fe Fumarate-FA (PRENATAL VITAMIN PO) Take 1 tablet by mouth daily.   05/16/2022   acetaminophen (TYLENOL) 500 MG tablet Take 1,000 mg by mouth every 8 (eight) hours as needed. Helps HA      montelukast (SINGULAIR) 10 MG tablet Take 1 tablet (10 mg total) by mouth at bedtime. 60 tablet 1  Review of Systems  Constitutional: Negative.  Negative for fatigue and fever.  HENT: Negative.    Respiratory: Negative.  Negative for shortness of breath.   Cardiovascular: Negative.  Negative for chest pain.  Gastrointestinal: Negative.  Negative for abdominal pain, constipation, diarrhea, nausea and vomiting.  Genitourinary:  Positive for vaginal bleeding. Negative for dysuria and vaginal discharge.  Neurological: Negative.  Negative for dizziness and headaches.   Physical Exam   Blood pressure 124/71, pulse 77, temperature 98.3 F (36.8 C), resp. rate 17, height 5\' 6"  (1.676 m), weight 89.8 kg, last menstrual period 05/22/2020, SpO2 97 %, unknown if currently breastfeeding.  Patient Vitals for the past 24 hrs:  BP Temp Pulse Resp SpO2 Height Weight  05/16/22 2123 124/71 -- -- -- -- -- --  05/16/22 2120 -- 98.3 F (36.8 C) 77 17 97 % 5\' 6"  (1.676 m) 89.8 kg    Physical Exam Vitals and nursing note reviewed.  Constitutional:      General: She is not in acute distress.    Appearance: She is well-developed.  HENT:     Head:  Normocephalic.  Eyes:     Pupils: Pupils are equal, round, and reactive to light.  Cardiovascular:     Rate and Rhythm: Normal rate and regular rhythm.     Heart sounds: Normal heart sounds.  Pulmonary:     Effort: Pulmonary effort is normal. No respiratory distress.     Breath sounds: Normal breath sounds.  Abdominal:     General: Bowel sounds are normal. There is no distension.     Palpations: Abdomen is soft.     Tenderness: There is no abdominal tenderness.  Genitourinary:    Comments: SSE: small amount of bright red blood in vault, cervix visually closed Skin:    General: Skin is warm and dry.  Neurological:     Mental Status: She is alert and oriented to person, place, and time.  Psychiatric:        Mood and Affect: Mood normal.        Behavior: Behavior normal.        Thought Content: Thought content normal.        Judgment: Judgment normal.     Fetal Tracing:  Baseline: 120 Variability: moderate Accels: 15x15 Decels: none  Toco: none  MAU Course  Procedures  MDM UA Labs ordered and reviewed.   SSE: small amount of bright red blood in vault AB positive blood type  CNM consulted with Dr. Alysia Penna regarding presentation and results- MD recommends admission to [facility] for observation due to bleeding previa  Patient with significant concerns about admission due to having 52 month old at home. She does not want to stay overnight and be separated from child and there are issues with childcare for the child. Patient states she lives close to the hospital and will come back if bleeding worsens but reports feeling better because it has been minimal since 1800.   CNM spent significant amount of time at bedside with patient and support person discussing danger of bleeding previa. CNM discussed significant risk of hemorrhage with current active bleeding. Discussed how hemorrhage of placenta can cause fetal and maternal death. Discussed how placenta previa bleeding can turn into  a massive hemorrhage quickly and cannot guarantee that labor team would be able to help patient or fetus if this occurs at home. CNM encouraged patient to stay with continuous monitoring to ensure safety of fetus and mother.   Patient and support person  state they do not want to stay and will return if bleeding returns. CNM counseled patient that this would be leaving AGAINST MEDICAL ADVICE and patient verbalized understanding.   Assessment and Plan   1. Placenta previa in third trimester   2. [redacted] weeks gestation of pregnancy    -Patient left AMA  Rolm Bookbinder, CNM 05/16/2022, 9:47 PM

## 2022-05-16 NOTE — MAU Note (Signed)
.  Amber Alvarez is a 22 y.o. at [redacted]w[redacted]d here in MAU reporting vaginal bleeding since yesterday morning. Pt thought it may go away but has continued. Started out being bright red but was more pink later on. States tonight it was bright about 1800. Does have placenta previa. States she has been doing more walking trying to get the placenta to move. Some lower back and vaginal pain.   Onset of complaint: yesterday am Pain score: 4 Vitals:   05/16/22 2120 05/16/22 2123  BP:  124/71  Pulse: 77   Resp: 17   Temp: 98.3 F (36.8 C)   SpO2: 97%      FHT:133 Lab orders placed from triage:  u/a

## 2022-05-22 ENCOUNTER — Encounter (HOSPITAL_COMMUNITY): Payer: Self-pay | Admitting: Obstetrics & Gynecology

## 2022-05-22 ENCOUNTER — Inpatient Hospital Stay (HOSPITAL_COMMUNITY)
Admission: AD | Admit: 2022-05-22 | Discharge: 2022-05-23 | Disposition: A | Discharge status: Home / Self Care | Payer: 59 | Attending: Obstetrics and Gynecology | Admitting: Obstetrics and Gynecology

## 2022-05-22 DIAGNOSIS — O4703 False labor before 37 completed weeks of gestation, third trimester: Secondary | ICD-10-CM | POA: Diagnosis not present

## 2022-05-22 DIAGNOSIS — Z3689 Encounter for other specified antenatal screening: Secondary | ICD-10-CM | POA: Diagnosis not present

## 2022-05-22 DIAGNOSIS — Z3A35 35 weeks gestation of pregnancy: Secondary | ICD-10-CM | POA: Insufficient documentation

## 2022-05-22 DIAGNOSIS — O4403 Placenta previa specified as without hemorrhage, third trimester: Secondary | ICD-10-CM | POA: Diagnosis not present

## 2022-05-22 DIAGNOSIS — O26893 Other specified pregnancy related conditions, third trimester: Secondary | ICD-10-CM | POA: Insufficient documentation

## 2022-05-22 DIAGNOSIS — O479 False labor, unspecified: Secondary | ICD-10-CM

## 2022-05-22 LAB — URINALYSIS, ROUTINE W REFLEX MICROSCOPIC
Bilirubin Urine: NEGATIVE
Glucose, UA: NEGATIVE mg/dL
Hgb urine dipstick: NEGATIVE
Ketones, ur: NEGATIVE mg/dL
Leukocytes,Ua: NEGATIVE
Nitrite: NEGATIVE
Protein, ur: NEGATIVE mg/dL
Specific Gravity, Urine: 1.018 (ref 1.005–1.030)
pH: 6 (ref 5.0–8.0)

## 2022-05-22 LAB — WET PREP, GENITAL
Clue Cells Wet Prep HPF POC: NONE SEEN
Sperm: NONE SEEN
Trich, Wet Prep: NONE SEEN
WBC, Wet Prep HPF POC: >=10 — AB (ref <10)
Yeast Wet Prep HPF POC: NONE SEEN

## 2022-05-22 MED ORDER — NIFEDIPINE 10 MG PO CAPS
10.0000 mg | ORAL_CAPSULE | ORAL | Status: AC
  Administered 2022-05-23 (×3): 10 mg via ORAL
  Filled 2022-05-22 (×3): qty 1

## 2022-05-22 MED ORDER — LACTATED RINGERS IV BOLUS
1000.0000 mL | Freq: Once | INTRAVENOUS | Status: AC
  Administered 2022-05-23: 1000 mL via INTRAVENOUS

## 2022-05-22 NOTE — MAU Note (Signed)
.  Amber Alvarez is a 22 y.o. at [redacted]w[redacted]d here in MAU reporting: mid ABD pain around her umbilicus with tightening for the last 2 days that is intermittent and increasing in frequency. Pt took Tylenol  this am, no relief. Pt endorses FM. Pt reports white discharge. Pt denies VB, LOF, PIH s/s. No recent intercourse. Placenta Previa, GDM- Diet Control  Onset of complaint: 2 days  Pain score: 4/10 Vitals:   05/22/22 2148  BP: 119/76  Pulse: 84  Resp: 18  Temp: 97.9 F (36.6 C)  SpO2: 99%     FHT:120 Lab orders placed from triage:  UA

## 2022-05-22 NOTE — MAU Provider Note (Incomplete)
History     CSN: [id number]  Arrival date and time: 05/22/22 2119   Event Date/Time   First Provider Initiated Contact with Patient 05/22/22 2254      Chief Complaint  Patient presents with   Abdominal Pain   22 y.o. G2P1001 .5 wks with placenta previa presenting with abdominal tightening and vaginal discharge. Rpeorts constant abdominal pain with intermittent tightening x2 days. Denies VB or spotting but reports clear watery discharge since the pain started. Denies gush of fluid. Reports +FM.     OB History     Gravida  2   Para  1   Term  1   Preterm      AB      Living  1      SAB      IAB      Ectopic      Multiple  0   Live Births  1           Past Medical History:  Diagnosis Date   Anemia    Anxiety    with preg, anious about baby   Gestational diabetes    Gestational diabetes mellitus, class A1 02/25/2021   Postpartum A1C was 5.4 % at her PCPs   UTI (urinary tract infection)     Past Surgical History:  Procedure Laterality Date   NO PAST SURGERIES      Family History  Problem Relation Age of Onset   Healthy Mother    Liver disease Father        died from pneumonia    Social History   Tobacco Use   Smoking status: Never    Passive exposure: Never   Smokeless tobacco: Never  Vaping Use   Vaping Use: Never used  Substance Use Topics   Alcohol use: Never   Drug use: Never    Allergies:  Allergies  Allergen Reactions   Beef-Derived Products     Not an allergy, halal dietary restriction   Chicken Protein     Pt cannot eat meats that are not Halal certified. This is not an allergy.    Fish-Derived Products     Pt cannot eat meats and seafood that are not Halal certified. This is not an allergy.    Pork-Derived Products     Not an allergy, halal dietary restriction   Singulair [Montelukast]     Helped breathing, but made her very dizzy    Medications Prior to Admission  Medication Sig Dispense Refill Last Dose    aspirin EC 81 MG tablet Take 1 tablet (81 mg total) by mouth daily. Take after 12 weeks for prevention of preeclampsia later in pregnancy 300 tablet 2 05/22/2022   Prenatal Vit-Fe Fumarate-FA (PRENATAL VITAMIN PO) Take 1 tablet by mouth daily.   05/22/2022   acetaminophen (TYLENOL) 500 MG tablet Take 1,000 mg by mouth every 8 (eight) hours as needed. Helps HA      montelukast (SINGULAIR) 10 MG tablet Take 1 tablet (10 mg total) by mouth at bedtime. 60 tablet 1    ondansetron (ZOFRAN-ODT) 8 MG disintegrating tablet Take 1 tablet (8 mg total) by mouth every 8 (eight) hours as needed for nausea or vomiting. 30 tablet 2     Review of Systems  Gastrointestinal:  Positive for abdominal pain.  Genitourinary:  Positive for vaginal discharge. Negative for dysuria and vaginal bleeding.   Physical Exam   Blood pressure 122/69, pulse 96, temperature 97.9 F (36.6 C), temperature source Oral, resp.  rate 18, height 5\' 6"  (1.676 m), weight 90.1 kg, last menstrual period 05/22/2020, SpO2 99 %, unknown if currently breastfeeding.  Physical Exam Vitals and nursing note reviewed.  Constitutional:      General: She is not in acute distress (appears comfortable).    Appearance: Normal appearance.  HENT:     Head: Normocephalic and atraumatic.  Cardiovascular:     Rate and Rhythm: Normal rate.  Pulmonary:     Effort: Pulmonary effort is normal. No respiratory distress.  Abdominal:     Palpations: Abdomen is soft.     Tenderness: There is no abdominal tenderness.  Genitourinary:    Comments: SSE: no pool, milky discharge, visually closed Musculoskeletal:        General: Normal range of motion.     Cervical back: Normal range of motion.  Skin:    General: Skin is warm and dry.  Neurological:     General: No focal deficit present.     Mental Status: She is alert and oriented to person, place, and time.  Psychiatric:        Mood and Affect: Mood normal.        Behavior: Behavior normal.   EFM: 135  bpm, mod variability, + accels, no decels Toco: irreg  Results for orders placed or performed during the hospital encounter of 05/22/22 (from the past 24 hour(s))  Urinalysis, Routine w reflex microscopic -Urine, Clean Catch     Status: Abnormal   Collection Time: 05/22/22  9:40 PM  Result Value Ref Range   Color, Urine YELLOW YELLOW   APPearance HAZY (A) CLEAR   Specific Gravity, Urine 1.018 1.005 - 1.030   pH 6.0 5.0 - 8.0   Glucose, UA NEGATIVE NEGATIVE mg/dL   Hgb urine dipstick NEGATIVE NEGATIVE   Bilirubin Urine NEGATIVE NEGATIVE   Ketones, ur NEGATIVE NEGATIVE mg/dL   Protein, ur NEGATIVE NEGATIVE mg/dL   Nitrite NEGATIVE NEGATIVE   Leukocytes,Ua NEGATIVE NEGATIVE  Wet prep, genital     Status: Abnormal   Collection Time: 05/22/22 11:14 PM  Result Value Ref Range   Yeast Wet Prep HPF POC NONE SEEN NONE SEEN   Trich, Wet Prep NONE SEEN NONE SEEN   Clue Cells Wet Prep HPF POC NONE SEEN NONE SEEN   WBC, Wet Prep HPF POC >=10 (A) <10   Sperm NONE SEEN   Fern Test     Status: None   Collection Time: 05/23/22 12:06 AM  Result Value Ref Range   POCT Fern Test Negative = intact amniotic membranes    MAU Course  Procedures  MDM Prenatal records reviewed. Pregnancy complicated by A1GDM, placenta previa, short pregnancy interval, hx of macrosomic infant. UTI. Labs ordered and reviewed.  No signs of labor or SROM however husband and pt requesting IVF and meds for ctx. LR and Procardia ordered. 0130: pt reports improvement in ctx; toco irreg q5-10, appears comfortable; husband requesting to speak with Dr. Vergie Living as he is not comfortable with d/c home, states pt is in too much pain.  0220: Dr. Vergie Living at bedside. After eval and discussion, plan for discharge.  Message sent to surgery scheduler for CS.  Assessment and Plan   1. [redacted] weeks gestation of pregnancy   2. Braxton Hicks contractions   3. NST (non-stress test) reactive    Discharge home Follow up at [facility] this week as  scheduled PTL precautions FMCs  Allergies as of 05/23/2022       Reactions   Beef-derived Products  Not an allergy, halal dietary restriction   Chicken Protein    Pt cannot eat meats that are not Halal certified. This is not an allergy.    Fish-derived Products    Pt cannot eat meats and seafood that are not Halal certified. This is not an allergy.    Pork-derived Products    Not an allergy, halal dietary restriction   Singulair [montelukast]    Helped breathing, but made her very dizzy        Medication List     TAKE these medications    acetaminophen 500 MG tablet Commonly known as: TYLENOL Take 1,000 mg by mouth every 8 (eight) hours as needed. Helps HA   aspirin EC 81 MG tablet Take 1 tablet (81 mg total) by mouth daily. Take after 12 weeks for prevention of preeclampsia later in pregnancy   montelukast 10 MG tablet Commonly known as: Singulair Take 1 tablet (10 mg total) by mouth at bedtime.   ondansetron 8 MG disintegrating tablet Commonly known as: ZOFRAN-ODT Take 1 tablet (8 mg total) by mouth every 8 (eight) hours as needed for nausea or vomiting.   PRENATAL VITAMIN PO Take 1 tablet by mouth daily.        Husband used for interpretation-consent signed Donette Larry, CNM 05/23/2022, 1:48 AM

## 2022-05-23 ENCOUNTER — Other Ambulatory Visit: Payer: Self-pay | Admitting: Obstetrics and Gynecology

## 2022-05-23 DIAGNOSIS — Z3A35 35 weeks gestation of pregnancy: Secondary | ICD-10-CM

## 2022-05-23 DIAGNOSIS — O479 False labor, unspecified: Secondary | ICD-10-CM

## 2022-05-23 DIAGNOSIS — O4403 Placenta previa specified as without hemorrhage, third trimester: Secondary | ICD-10-CM

## 2022-05-23 DIAGNOSIS — Z3689 Encounter for other specified antenatal screening: Secondary | ICD-10-CM

## 2022-05-23 LAB — POCT FERN TEST: POCT Fern Test: NEGATIVE

## 2022-05-25 ENCOUNTER — Encounter (HOSPITAL_COMMUNITY): Payer: Self-pay

## 2022-05-25 ENCOUNTER — Telehealth (HOSPITAL_COMMUNITY): Payer: Self-pay | Admitting: *Deleted

## 2022-05-25 NOTE — Telephone Encounter (Signed)
Preadmission screen  

## 2022-05-26 ENCOUNTER — Encounter (HOSPITAL_COMMUNITY): Payer: Self-pay

## 2022-05-26 ENCOUNTER — Other Ambulatory Visit: Payer: Self-pay

## 2022-05-26 ENCOUNTER — Other Ambulatory Visit (HOSPITAL_COMMUNITY)
Admission: RE | Admit: 2022-05-26 | Discharge: 2022-05-26 | Disposition: A | Discharge status: Home / Self Care | Payer: 59 | Source: Ambulatory Visit | Attending: Family Medicine | Admitting: Family Medicine

## 2022-05-26 ENCOUNTER — Ambulatory Visit (INDEPENDENT_AMBULATORY_CARE_PROVIDER_SITE_OTHER): Payer: 59 | Admitting: Advanced Practice Midwife

## 2022-05-26 VITALS — BP 107/64 | HR 90 | Wt 198.5 lb

## 2022-05-26 DIAGNOSIS — O09299 Supervision of pregnancy with other poor reproductive or obstetric history, unspecified trimester: Secondary | ICD-10-CM

## 2022-05-26 DIAGNOSIS — Z3A36 36 weeks gestation of pregnancy: Secondary | ICD-10-CM

## 2022-05-26 DIAGNOSIS — O3663X0 Maternal care for excessive fetal growth, third trimester, not applicable or unspecified: Secondary | ICD-10-CM

## 2022-05-26 DIAGNOSIS — O09893 Supervision of other high risk pregnancies, third trimester: Secondary | ICD-10-CM

## 2022-05-26 DIAGNOSIS — Z3493 Encounter for supervision of normal pregnancy, unspecified, third trimester: Secondary | ICD-10-CM | POA: Diagnosis not present

## 2022-05-26 DIAGNOSIS — O0990 Supervision of high risk pregnancy, unspecified, unspecified trimester: Secondary | ICD-10-CM

## 2022-05-26 DIAGNOSIS — O26893 Other specified pregnancy related conditions, third trimester: Secondary | ICD-10-CM | POA: Diagnosis not present

## 2022-05-26 DIAGNOSIS — O09899 Supervision of other high risk pregnancies, unspecified trimester: Secondary | ICD-10-CM

## 2022-05-26 DIAGNOSIS — O0993 Supervision of high risk pregnancy, unspecified, third trimester: Secondary | ICD-10-CM

## 2022-05-26 DIAGNOSIS — O3660X0 Maternal care for excessive fetal growth, unspecified trimester, not applicable or unspecified: Secondary | ICD-10-CM

## 2022-05-26 DIAGNOSIS — Z758 Other problems related to medical facilities and other health care: Secondary | ICD-10-CM

## 2022-05-26 DIAGNOSIS — O09293 Supervision of pregnancy with other poor reproductive or obstetric history, third trimester: Secondary | ICD-10-CM

## 2022-05-26 DIAGNOSIS — O4403 Placenta previa specified as without hemorrhage, third trimester: Secondary | ICD-10-CM

## 2022-05-26 DIAGNOSIS — Z603 Acculturation difficulty: Secondary | ICD-10-CM

## 2022-05-26 DIAGNOSIS — O24415 Gestational diabetes mellitus in pregnancy, controlled by oral hypoglycemic drugs: Secondary | ICD-10-CM

## 2022-05-26 NOTE — Patient Instructions (Signed)
Jacquline Terrill  05/26/2022   Your procedure is scheduled on:  05/31/2022  Arrive at 0930 at Entrance C on [address] at [facility]  and [employer]. You are invited to use the FREE valet parking or use the Visitor's parking deck.  Pick up the phone at the desk and dial [phone].  Call this number if you have problems the morning of surgery: [phone]  Remember:   Do not eat food:(After Midnight) Desps de medianoche.  Do not drink clear liquids: (After Midnight) Desps de medianoche.  Take these medicines the morning of surgery with A SIP OF WATER:  none   Do not wear jewelry, make-up or nail polish.  Do not wear lotions, powders, or perfumes. Do not wear deodorant.  Do not shave 48 hours prior to surgery.  Do not bring valuables to the hospital.  [facility] is not   responsible for any belongings or valuables brought to the hospital.  Contacts, dentures or bridgework may not be worn into surgery.  Leave suitcase in the car. After surgery it may be brought to your room.  For patients admitted to the hospital, checkout time is 11:00 AM the day of              discharge.      Please read over the following fact sheets that you were given:     Preparing for Surgery

## 2022-05-26 NOTE — Progress Notes (Signed)
   PRENATAL VISIT NOTE  Subjective:  Amber Alvarez is a 22 y.o. G2P1001 at [redacted]w[redacted]d being seen today for ongoing prenatal care.  She is currently monitored for the following issues for this high-risk pregnancy and has Excessive fetal growth affecting management of mother, antepartum; Language barrier; History of cholelithiasis; History of gestational diabetes; Supervision of high risk pregnancy, antepartum; Short interval between pregnancies affecting pregnancy, antepartum; UTI (urinary tract infection) during pregnancy, first trimester; History of macrosomia in infant in prior pregnancy, currently pregnant; Gestational diabetes; and Placenta previa on their problem list.  Patient reports no bleeding, no leaking, and occasional contractions.  Contractions: Not present. Vag. Bleeding: None.  Movement: Present. Denies leaking of fluid.   The following portions of the patient's history were reviewed and updated as appropriate: allergies, current medications, past family history, past medical history, past social history, past surgical history and problem list.   Objective:   Vitals:   05/26/22 0837  BP: 107/64  Pulse: 90  Weight: 198 lb 8 oz (90 kg)    Fetal Status: Fetal Heart Rate (bpm): 138   Movement: Present     General:  Alert, oriented and cooperative. Patient is in no acute distress.  Skin: Skin is warm and dry. No rash noted.   Cardiovascular: Normal heart rate noted  Respiratory: Normal respiratory effort, no problems with respiration noted  Abdomen: Soft, gravid, appropriate for gestational age.  Pain/Pressure: Present     Pelvic: Cervical exam performed in the presence of a chaperone        Extremities: Normal range of motion.  Edema: Trace  Mental Status: Normal mood and affect. Normal behavior. Normal judgment and thought content.   All blood sugar in range  Assessment and Plan:  Pregnancy: G2P1001 at [redacted]w[redacted]d 1. Gestational diabetes mellitus (GDM) in third trimester controlled  on oral hypoglycemic drug - Well=controlled  2. Supervision of high risk pregnancy, antepartum  3. Short interval between pregnancies affecting pregnancy, antepartum - No evidence of PTL  4. Placenta previa in third trimester - PCS scheduled 5/1 - [place] 4/30 to double check previa  5. Language barrier - Interpreter used  6. History of macrosomia in infant in prior pregnancy, currently pregnant - Growth [place] scheduled.  - 84%tile 4/9  7. Excessive fetal growth affecting management of pregnancy, antepartum, single or unspecified fetus - Growth [place] scheduled.  - 84%tile 4/9  8. [redacted] weeks gestation of pregnancy - GSB and cultures done  Term labor symptoms and general obstetric precautions including but not limited to vaginal bleeding, contractions, leaking of fluid and fetal movement were reviewed in detail with the patient. Please refer to After Visit Summary for other counseling recommendations.   No follow-ups on file.  Future Appointments  Date Time Provider Department Center  05/29/2022 10:30 AM [facility] PAT 1 [facility] None  05/30/2022  1:30 PM [facility] NURSE [facility]  05/30/2022  1:45 PM [facility] US6 [facility]    Dorathy Kinsman, CNM

## 2022-05-26 NOTE — Addendum Note (Signed)
Addended by: Brien Mates T on: 05/26/2022 09:27 AM   Modules accepted: Orders

## 2022-05-29 ENCOUNTER — Encounter (HOSPITAL_COMMUNITY)
Admission: RE | Admit: 2022-05-29 | Discharge: 2022-05-29 | Disposition: A | Discharge status: Home / Self Care | Payer: 59 | Source: Ambulatory Visit | Attending: Obstetrics and Gynecology | Admitting: Obstetrics and Gynecology

## 2022-05-29 ENCOUNTER — Encounter: Payer: Self-pay | Admitting: Advanced Practice Midwife

## 2022-05-29 DIAGNOSIS — O99820 Streptococcus B carrier state complicating pregnancy: Secondary | ICD-10-CM | POA: Insufficient documentation

## 2022-05-29 HISTORY — DX: Complete placenta previa nos or without hemorrhage, unspecified trimester: O44.00

## 2022-05-29 LAB — GC/CHLAMYDIA PROBE AMP (~~LOC~~) NOT AT ARMC
Chlamydia: NEGATIVE
Comment: NEGATIVE
Comment: NORMAL
Neisseria Gonorrhea: NEGATIVE

## 2022-05-29 LAB — CULTURE, BETA STREP (GROUP B ONLY): Strep Gp B Culture: POSITIVE — AB

## 2022-05-30 ENCOUNTER — Other Ambulatory Visit: Payer: Self-pay | Admitting: Family Medicine

## 2022-05-30 ENCOUNTER — Ambulatory Visit: Payer: 59 | Admitting: *Deleted

## 2022-05-30 ENCOUNTER — Encounter (HOSPITAL_COMMUNITY)
Admission: RE | Admit: 2022-05-30 | Discharge: 2022-05-30 | Disposition: A | Discharge status: Home / Self Care | Payer: 59 | Source: Ambulatory Visit | Attending: Obstetrics and Gynecology | Admitting: Obstetrics and Gynecology

## 2022-05-30 ENCOUNTER — Ambulatory Visit (HOSPITAL_BASED_OUTPATIENT_CLINIC_OR_DEPARTMENT_OTHER): Payer: 59

## 2022-05-30 VITALS — BP 122/81 | HR 84

## 2022-05-30 DIAGNOSIS — O24410 Gestational diabetes mellitus in pregnancy, diet controlled: Secondary | ICD-10-CM

## 2022-05-30 DIAGNOSIS — O4403 Placenta previa specified as without hemorrhage, third trimester: Secondary | ICD-10-CM | POA: Insufficient documentation

## 2022-05-30 DIAGNOSIS — O4443 Low lying placenta NOS or without hemorrhage, third trimester: Secondary | ICD-10-CM

## 2022-05-30 DIAGNOSIS — O24419 Gestational diabetes mellitus in pregnancy, unspecified control: Secondary | ICD-10-CM

## 2022-05-30 DIAGNOSIS — O3663X0 Maternal care for excessive fetal growth, third trimester, not applicable or unspecified: Secondary | ICD-10-CM | POA: Insufficient documentation

## 2022-05-30 DIAGNOSIS — O09293 Supervision of pregnancy with other poor reproductive or obstetric history, third trimester: Secondary | ICD-10-CM

## 2022-05-30 DIAGNOSIS — Z3A36 36 weeks gestation of pregnancy: Secondary | ICD-10-CM | POA: Diagnosis not present

## 2022-05-30 LAB — TYPE AND SCREEN
ABO/RH(D): AB POS
Antibody Screen: NEGATIVE

## 2022-05-30 LAB — CBC
HCT: 36.7 % (ref 36.0–46.0)
Hemoglobin: 12.6 g/dL (ref 12.0–15.0)
MCH: 31.3 pg (ref 26.0–34.0)
MCHC: 34.3 g/dL (ref 30.0–36.0)
MCV: 91.3 fL (ref 80.0–100.0)
Platelets: 220 10*3/uL (ref 150–400)
RBC: 4.02 MIL/uL (ref 3.87–5.11)
RDW: 13.5 % (ref 11.5–15.5)
WBC: 9.1 10*3/uL (ref 4.0–10.5)
nRBC: 0 % (ref 0.0–0.2)

## 2022-05-30 LAB — RPR: RPR Ser Ql: NONREACTIVE

## 2022-05-30 NOTE — Patient Instructions (Signed)
Amber Alvarez  05/30/2022   Your procedure is scheduled on:  05/31/2022  Arrive at 0930 at Entrance C on [address] at [facility]  and [employer]. You are invited to use the FREE valet parking or use the Visitor's parking deck.  Pick up the phone at the desk and dial [phone].  Call this number if you have problems the morning of surgery: [phone]  Remember:   Do not eat food:(After Midnight) Desps de medianoche.  Do not drink clear liquids: (After Midnight) Desps de medianoche.  Take these medicines the morning of surgery with A SIP OF WATER:  none   Do not wear jewelry, make-up or nail polish.  Do not wear lotions, powders, or perfumes. Do not wear deodorant.  Do not shave 48 hours prior to surgery.  Do not bring valuables to the hospital.  [facility] is not   responsible for any belongings or valuables brought to the hospital.  Contacts, dentures or bridgework may not be worn into surgery.  Leave suitcase in the car. After surgery it may be brought to your room.  For patients admitted to the hospital, checkout time is 11:00 AM the day of              discharge.      Please read over the following fact sheets that you were given:     Preparing for Surgery

## 2022-05-31 ENCOUNTER — Other Ambulatory Visit: Payer: Self-pay

## 2022-05-31 ENCOUNTER — Inpatient Hospital Stay (HOSPITAL_COMMUNITY): Payer: 59 | Admitting: Anesthesiology

## 2022-05-31 ENCOUNTER — Inpatient Hospital Stay (HOSPITAL_COMMUNITY)
Admission: RE | Admit: 2022-05-31 | Discharge: 2022-06-03 | DRG: 787 | Disposition: A | Discharge status: Home / Self Care | Payer: 59 | Attending: Obstetrics and Gynecology | Admitting: Obstetrics and Gynecology

## 2022-05-31 ENCOUNTER — Encounter (HOSPITAL_COMMUNITY)
Admission: RE | Disposition: A | Discharge status: Home / Self Care | Payer: Self-pay | Source: Home / Self Care | Attending: Obstetrics and Gynecology

## 2022-05-31 ENCOUNTER — Encounter (HOSPITAL_COMMUNITY): Payer: Self-pay | Admitting: Obstetrics and Gynecology

## 2022-05-31 DIAGNOSIS — Z3A37 37 weeks gestation of pregnancy: Secondary | ICD-10-CM

## 2022-05-31 DIAGNOSIS — O3663X0 Maternal care for excessive fetal growth, third trimester, not applicable or unspecified: Secondary | ICD-10-CM | POA: Diagnosis not present

## 2022-05-31 DIAGNOSIS — O9902 Anemia complicating childbirth: Secondary | ICD-10-CM | POA: Diagnosis not present

## 2022-05-31 DIAGNOSIS — F419 Anxiety disorder, unspecified: Secondary | ICD-10-CM

## 2022-05-31 DIAGNOSIS — O24419 Gestational diabetes mellitus in pregnancy, unspecified control: Secondary | ICD-10-CM | POA: Diagnosis present

## 2022-05-31 DIAGNOSIS — O24429 Gestational diabetes mellitus in childbirth, unspecified control: Secondary | ICD-10-CM

## 2022-05-31 DIAGNOSIS — O99344 Other mental disorders complicating childbirth: Secondary | ICD-10-CM

## 2022-05-31 DIAGNOSIS — O4440 Low lying placenta NOS or without hemorrhage, unspecified trimester: Secondary | ICD-10-CM | POA: Insufficient documentation

## 2022-05-31 DIAGNOSIS — O99820 Streptococcus B carrier state complicating pregnancy: Secondary | ICD-10-CM | POA: Diagnosis not present

## 2022-05-31 DIAGNOSIS — O24420 Gestational diabetes mellitus in childbirth, diet controlled: Secondary | ICD-10-CM | POA: Diagnosis present

## 2022-05-31 DIAGNOSIS — O99824 Streptococcus B carrier state complicating childbirth: Secondary | ICD-10-CM | POA: Diagnosis present

## 2022-05-31 DIAGNOSIS — O0990 Supervision of high risk pregnancy, unspecified, unspecified trimester: Secondary | ICD-10-CM

## 2022-05-31 DIAGNOSIS — Z8759 Personal history of other complications of pregnancy, childbirth and the puerperium: Secondary | ICD-10-CM | POA: Insufficient documentation

## 2022-05-31 DIAGNOSIS — O09299 Supervision of pregnancy with other poor reproductive or obstetric history, unspecified trimester: Secondary | ICD-10-CM

## 2022-05-31 DIAGNOSIS — Z98891 History of uterine scar from previous surgery: Principal | ICD-10-CM | POA: Insufficient documentation

## 2022-05-31 DIAGNOSIS — O3660X0 Maternal care for excessive fetal growth, unspecified trimester, not applicable or unspecified: Secondary | ICD-10-CM

## 2022-05-31 DIAGNOSIS — Z8632 Personal history of gestational diabetes: Secondary | ICD-10-CM | POA: Diagnosis present

## 2022-05-31 DIAGNOSIS — O4443 Low lying placenta NOS or without hemorrhage, third trimester: Secondary | ICD-10-CM

## 2022-05-31 LAB — CBC
HCT: 42.8 % (ref 36.0–46.0)
Hemoglobin: 13.8 g/dL (ref 12.0–15.0)
MCH: 30.2 pg (ref 26.0–34.0)
MCHC: 32.2 g/dL (ref 30.0–36.0)
MCV: 93.7 fL (ref 80.0–100.0)
Platelets: 207 10*3/uL (ref 150–400)
RBC: 4.57 MIL/uL (ref 3.87–5.11)
RDW: 13.5 % (ref 11.5–15.5)
WBC: 11.3 10*3/uL — ABNORMAL HIGH (ref 4.0–10.5)
nRBC: 0 % (ref 0.0–0.2)

## 2022-05-31 LAB — DIC (DISSEMINATED INTRAVASCULAR COAGULATION)PANEL
D-Dimer, Quant: 1.21 ug/mL-FEU — ABNORMAL HIGH (ref 0.00–0.50)
Fibrinogen: 530 mg/dL — ABNORMAL HIGH (ref 210–475)
INR: 1 (ref 0.8–1.2)
Platelets: 201 10*3/uL (ref 150–400)
Prothrombin Time: 13.2 seconds (ref 11.4–15.2)
Smear Review: NONE SEEN
aPTT: 27 seconds (ref 24–36)

## 2022-05-31 LAB — GLUCOSE, CAPILLARY
Glucose-Capillary: 80 mg/dL (ref 70–99)
Glucose-Capillary: 71 mg/dL (ref 70–99)

## 2022-05-31 SURGERY — Surgical Case
Anesthesia: Spinal

## 2022-05-31 MED ORDER — FENTANYL CITRATE (PF) 100 MCG/2ML IJ SOLN
INTRAMUSCULAR | Status: DC | PRN
  Administered 2022-05-31: 15 ug via INTRATHECAL

## 2022-05-31 MED ORDER — SENNOSIDES-DOCUSATE SODIUM 8.6-50 MG PO TABS
2.0000 | ORAL_TABLET | ORAL | Status: DC
  Administered 2022-06-01 – 2022-06-03 (×3): 2 via ORAL
  Filled 2022-05-31 (×3): qty 2

## 2022-05-31 MED ORDER — FENTANYL CITRATE (PF) 100 MCG/2ML IJ SOLN
INTRAMUSCULAR | Status: AC
  Filled 2022-05-31: qty 2

## 2022-05-31 MED ORDER — MORPHINE SULFATE (PF) 0.5 MG/ML IJ SOLN
INTRAMUSCULAR | Status: AC
  Filled 2022-05-31: qty 10

## 2022-05-31 MED ORDER — DIPHENHYDRAMINE HCL 25 MG PO CAPS: 25.0000 mg | ORAL_CAPSULE | Freq: Four times a day (QID) | ORAL | Status: DC | PRN

## 2022-05-31 MED ORDER — OXYCODONE HCL 5 MG PO TABS
5.0000 mg | ORAL_TABLET | ORAL | Status: DC | PRN
  Administered 2022-06-01 – 2022-06-02 (×7): 5 mg via ORAL
  Filled 2022-05-31 (×7): qty 1

## 2022-05-31 MED ORDER — SODIUM CHLORIDE 0.9 % IV SOLN: INTRAVENOUS | Status: DC | PRN

## 2022-05-31 MED ORDER — IBUPROFEN 600 MG PO TABS
600.0000 mg | ORAL_TABLET | Freq: Four times a day (QID) | ORAL | Status: DC
  Administered 2022-05-31 – 2022-06-03 (×11): 600 mg via ORAL
  Filled 2022-05-31 (×12): qty 1

## 2022-05-31 MED ORDER — KETOROLAC TROMETHAMINE 30 MG/ML IJ SOLN: 30.0000 mg | Freq: Once | INTRAMUSCULAR | Status: DC | PRN

## 2022-05-31 MED ORDER — SIMETHICONE 80 MG PO CHEW
80.0000 mg | CHEWABLE_TABLET | Freq: Three times a day (TID) | ORAL | Status: DC
  Administered 2022-06-01 – 2022-06-03 (×7): 80 mg via ORAL
  Filled 2022-05-31 (×7): qty 1

## 2022-05-31 MED ORDER — DIPHENHYDRAMINE HCL 50 MG/ML IJ SOLN
INTRAMUSCULAR | Status: AC
  Filled 2022-05-31: qty 1

## 2022-05-31 MED ORDER — ONDANSETRON HCL 4 MG/2ML IJ SOLN
INTRAMUSCULAR | Status: AC
  Filled 2022-05-31: qty 2

## 2022-05-31 MED ORDER — NALOXONE HCL 0.4 MG/ML IJ SOLN: 0.4000 mg | INTRAMUSCULAR | Status: DC | PRN

## 2022-05-31 MED ORDER — DEXAMETHASONE SODIUM PHOSPHATE 4 MG/ML IJ SOLN
INTRAMUSCULAR | Status: AC
  Filled 2022-05-31: qty 1

## 2022-05-31 MED ORDER — DIBUCAINE (PERIANAL) 1 % EX OINT: 1.0000 | TOPICAL_OINTMENT | CUTANEOUS | Status: DC | PRN

## 2022-05-31 MED ORDER — TRANEXAMIC ACID-NACL 1000-0.7 MG/100ML-% IV SOLN
1000.0000 mg | Freq: Once | INTRAVENOUS | Status: AC
  Administered 2022-05-31: 1000 mg via INTRAVENOUS

## 2022-05-31 MED ORDER — POVIDONE-IODINE 10 % EX SWAB
2.0000 | Freq: Once | CUTANEOUS | Status: AC
  Administered 2022-05-31: 2 via TOPICAL

## 2022-05-31 MED ORDER — PHENYLEPHRINE HCL-NACL 20-0.9 MG/250ML-% IV SOLN
INTRAVENOUS | Status: DC | PRN
  Administered 2022-05-31: 60 ug/min via INTRAVENOUS

## 2022-05-31 MED ORDER — ONDANSETRON HCL 4 MG/2ML IJ SOLN
4.0000 mg | Freq: Three times a day (TID) | INTRAMUSCULAR | Status: DC | PRN
  Administered 2022-05-31: 4 mg via INTRAVENOUS
  Filled 2022-05-31: qty 2

## 2022-05-31 MED ORDER — BUPIVACAINE IN DEXTROSE 0.75-8.25 % IT SOLN
INTRATHECAL | Status: DC | PRN
  Administered 2022-05-31: 1.6 mL via INTRATHECAL

## 2022-05-31 MED ORDER — ACETAMINOPHEN 500 MG PO TABS
1000.0000 mg | ORAL_TABLET | Freq: Four times a day (QID) | ORAL | Status: DC
  Administered 2022-05-31 – 2022-06-03 (×10): 1000 mg via ORAL
  Filled 2022-05-31 (×11): qty 2

## 2022-05-31 MED ORDER — SCOPOLAMINE 1 MG/3DAYS TD PT72: 1.0000 | MEDICATED_PATCH | Freq: Once | TRANSDERMAL | Status: DC

## 2022-05-31 MED ORDER — FENTANYL CITRATE (PF) 100 MCG/2ML IJ SOLN: 25.0000 ug | INTRAMUSCULAR | Status: DC | PRN

## 2022-05-31 MED ORDER — DIPHENHYDRAMINE HCL 25 MG PO CAPS: 25.0000 mg | ORAL_CAPSULE | ORAL | Status: DC | PRN

## 2022-05-31 MED ORDER — NALOXONE HCL 4 MG/10ML IJ SOLN: 1.0000 ug/kg/h | INTRAVENOUS | Status: DC | PRN

## 2022-05-31 MED ORDER — PRENATAL MULTIVITAMIN CH
1.0000 | ORAL_TABLET | Freq: Every day | ORAL | Status: DC
  Administered 2022-06-01 – 2022-06-02 (×2): 1 via ORAL
  Filled 2022-05-31 (×2): qty 1

## 2022-05-31 MED ORDER — ACETAMINOPHEN 500 MG PO TABS: 1000.0000 mg | ORAL_TABLET | Freq: Four times a day (QID) | ORAL | Status: DC

## 2022-05-31 MED ORDER — MEASLES, MUMPS & RUBELLA VAC IJ SOLR: 0.5000 mL | Freq: Once | INTRAMUSCULAR | Status: DC

## 2022-05-31 MED ORDER — TRANEXAMIC ACID-NACL 1000-0.7 MG/100ML-% IV SOLN
INTRAVENOUS | Status: AC
  Filled 2022-05-31: qty 100

## 2022-05-31 MED ORDER — LACTATED RINGERS IV SOLN: INTRAVENOUS | Status: DC

## 2022-05-31 MED ORDER — ENOXAPARIN SODIUM 40 MG/0.4ML IJ SOSY
40.0000 mg | PREFILLED_SYRINGE | INTRAMUSCULAR | Status: DC
  Administered 2022-06-01 – 2022-06-03 (×3): 40 mg via SUBCUTANEOUS
  Filled 2022-05-31 (×3): qty 0.4

## 2022-05-31 MED ORDER — MORPHINE SULFATE (PF) 0.5 MG/ML IJ SOLN
INTRAMUSCULAR | Status: DC | PRN
  Administered 2022-05-31: 150 ug via INTRATHECAL

## 2022-05-31 MED ORDER — KETOROLAC TROMETHAMINE 30 MG/ML IJ SOLN: 30.0000 mg | Freq: Four times a day (QID) | INTRAMUSCULAR | Status: AC | PRN

## 2022-05-31 MED ORDER — ZOLPIDEM TARTRATE 5 MG PO TABS: 5.0000 mg | ORAL_TABLET | Freq: Every evening | ORAL | Status: DC | PRN

## 2022-05-31 MED ORDER — SIMETHICONE 80 MG PO CHEW: 80.0000 mg | CHEWABLE_TABLET | ORAL | Status: DC | PRN

## 2022-05-31 MED ORDER — COCONUT OIL OIL: 1.0000 | TOPICAL_OIL | Status: DC | PRN

## 2022-05-31 MED ORDER — OXYTOCIN-SODIUM CHLORIDE 30-0.9 UT/500ML-% IV SOLN: 2.5000 [IU]/h | INTRAVENOUS | Status: AC

## 2022-05-31 MED ORDER — MENTHOL 3 MG MT LOZG: 1.0000 | LOZENGE | OROMUCOSAL | Status: DC | PRN

## 2022-05-31 MED ORDER — METHYLERGONOVINE MALEATE 0.2 MG/ML IJ SOLN
INTRAMUSCULAR | Status: AC
  Filled 2022-05-31: qty 1

## 2022-05-31 MED ORDER — TRANEXAMIC ACID-NACL 1000-0.7 MG/100ML-% IV SOLN: 1000.0000 mg | INTRAVENOUS | Status: DC

## 2022-05-31 MED ORDER — WITCH HAZEL-GLYCERIN EX PADS: 1.0000 | MEDICATED_PAD | CUTANEOUS | Status: DC | PRN

## 2022-05-31 MED ORDER — PHENYLEPHRINE HCL-NACL 20-0.9 MG/250ML-% IV SOLN
INTRAVENOUS | Status: AC
  Filled 2022-05-31: qty 250

## 2022-05-31 MED ORDER — DEXAMETHASONE SODIUM PHOSPHATE 10 MG/ML IJ SOLN
INTRAMUSCULAR | Status: DC | PRN
  Administered 2022-05-31: 4 mg via INTRAVENOUS

## 2022-05-31 MED ORDER — CEFAZOLIN SODIUM-DEXTROSE 2-4 GM/100ML-% IV SOLN
INTRAVENOUS | Status: AC
  Filled 2022-05-31: qty 100

## 2022-05-31 MED ORDER — TRANEXAMIC ACID-NACL 1000-0.7 MG/100ML-% IV SOLN
1000.0000 mg | INTRAVENOUS | Status: AC
  Administered 2022-05-31: 1000 mg via INTRAVENOUS

## 2022-05-31 MED ORDER — METHYLERGONOVINE MALEATE 0.2 MG/ML IJ SOLN
0.2000 mg | Freq: Once | INTRAMUSCULAR | Status: AC
  Administered 2022-05-31: 0.2 mg via INTRAMUSCULAR

## 2022-05-31 MED ORDER — ONDANSETRON HCL 4 MG/2ML IJ SOLN
INTRAMUSCULAR | Status: DC | PRN
  Administered 2022-05-31: 4 mg via INTRAVENOUS

## 2022-05-31 MED ORDER — CEFAZOLIN SODIUM-DEXTROSE 2-4 GM/100ML-% IV SOLN
2.0000 g | INTRAVENOUS | Status: AC
  Administered 2022-05-31: 2 g via INTRAVENOUS

## 2022-05-31 MED ORDER — OXYTOCIN-SODIUM CHLORIDE 30-0.9 UT/500ML-% IV SOLN
INTRAVENOUS | Status: DC | PRN
  Administered 2022-05-31: 300 mL via INTRAVENOUS

## 2022-05-31 MED ORDER — SODIUM CHLORIDE 0.9% FLUSH: 3.0000 mL | INTRAVENOUS | Status: DC | PRN

## 2022-05-31 MED ORDER — OXYTOCIN-SODIUM CHLORIDE 30-0.9 UT/500ML-% IV SOLN
INTRAVENOUS | Status: AC
  Filled 2022-05-31: qty 500

## 2022-05-31 MED ORDER — METHYLERGONOVINE MALEATE 0.2 MG/ML IJ SOLN: 0.2000 mg | Freq: Once | INTRAMUSCULAR | Status: DC

## 2022-05-31 MED ORDER — DIPHENHYDRAMINE HCL 50 MG/ML IJ SOLN
12.5000 mg | INTRAMUSCULAR | Status: DC | PRN
  Administered 2022-05-31 (×2): 12.5 mg via INTRAVENOUS

## 2022-05-31 SURGICAL SUPPLY — 34 items
APL PRP STRL LF DISP 70% ISPRP (MISCELLANEOUS) ×2
APL SKNCLS STERI-STRIP NONHPOA (GAUZE/BANDAGES/DRESSINGS) ×1
BENZOIN TINCTURE PRP APPL 2/3 (GAUZE/BANDAGES/DRESSINGS) ×1 IMPLANT
CHLORAPREP W/TINT 26 (MISCELLANEOUS) ×2 IMPLANT
CLAMP UMBILICAL CORD (MISCELLANEOUS) ×1 IMPLANT
CLOTH BEACON ORANGE TIMEOUT ST (SAFETY) ×1 IMPLANT
DRSG OPSITE POSTOP 4X10 (GAUZE/BANDAGES/DRESSINGS) ×1 IMPLANT
ELECT REM PT RETURN 9FT ADLT (ELECTROSURGICAL) ×1
ELECTRODE REM PT RTRN 9FT ADLT (ELECTROSURGICAL) ×1 IMPLANT
EXTRACTOR VACUUM M CUP 4 TUBE (SUCTIONS) IMPLANT
GLOVE BIOGEL PI IND STRL 7.0 (GLOVE) ×2 IMPLANT
GLOVE BIOGEL PI IND STRL 7.5 (GLOVE) ×2 IMPLANT
GLOVE ECLIPSE 7.5 STRL STRAW (GLOVE) ×1 IMPLANT
GOWN STRL REUS W/TWL LRG LVL3 (GOWN DISPOSABLE) ×3 IMPLANT
HEMOSTAT ARISTA ABSORB 3G PWDR (HEMOSTASIS) IMPLANT
KIT ABG SYR 3ML LUER SLIP (SYRINGE) IMPLANT
NDL HYPO 25X5/8 SAFETYGLIDE (NEEDLE) IMPLANT
NEEDLE HYPO 25X5/8 SAFETYGLIDE (NEEDLE) IMPLANT
NS IRRIG 1000ML POUR BTL (IV SOLUTION) ×1 IMPLANT
PACK C SECTION WH (CUSTOM PROCEDURE TRAY) ×1 IMPLANT
PAD ABD DERMACEA PRESS 5X9 (GAUZE/BANDAGES/DRESSINGS) IMPLANT
PAD OB MATERNITY 4.3X12.25 (PERSONAL CARE ITEMS) ×1 IMPLANT
RTRCTR C-SECT PINK 25CM LRG (MISCELLANEOUS) ×1 IMPLANT
STRIP CLOSURE SKIN 1/2X4 (GAUZE/BANDAGES/DRESSINGS) ×1 IMPLANT
SUT PLAIN 0 NONE (SUTURE) ×1 IMPLANT
SUT VIC AB 0 CT1 36 (SUTURE) ×3 IMPLANT
SUT VIC AB 0 CTX 36 (SUTURE) ×1
SUT VIC AB 0 CTX36XBRD ANBCTRL (SUTURE) ×1 IMPLANT
SUT VIC AB 2-0 CT1 27 (SUTURE) ×1
SUT VIC AB 2-0 CT1 TAPERPNT 27 (SUTURE) ×1 IMPLANT
SUT VIC AB 4-0 KS 27 (SUTURE) ×1 IMPLANT
TOWEL OR 17X24 6PK STRL BLUE (TOWEL DISPOSABLE) ×1 IMPLANT
TRAY FOLEY W/BAG SLVR 14FR LF (SET/KITS/TRAYS/PACK) ×1 IMPLANT
WATER STERILE IRR 1000ML POUR (IV SOLUTION) ×1 IMPLANT

## 2022-05-31 NOTE — Transfer of Care (Signed)
Immediate Anesthesia Transfer of Care Note  Patient: Amber Alvarez  Procedure(s) Performed: CESAREAN SECTION APPLICATION OF CELL SAVER  Patient Location: PACU  Anesthesia Type:Spinal  Level of Consciousness: awake  Airway & Oxygen Therapy: Patient Spontanous Breathing  Post-op Assessment: Report given to RN and Post -op Vital signs reviewed and stable  Post vital signs: Reviewed and stable  Last Vitals:  Vitals Value Taken Time  BP    Temp 36.7 C 05/31/22 1245  Pulse    Resp    SpO2      Last Pain:  Vitals:   05/31/22 1006  TempSrc: Oral  PainSc:          Complications: No notable events documented.

## 2022-05-31 NOTE — Progress Notes (Signed)
Patient re-evaluated about 1.5 hrs after cervical seal and suction were discontinued. She feels well. No bleeding noted around the [place] device. Suction re-hooked briefly. No increased bleeding noted.  Jada removed with no complications. Fundus about 1cm above umbilicus, firm, no significant bleeding noted on fundal massage. Appropriately tender.  Will continue to monitor Plan for am CBC tomorrow.  Patient's husband facilitated interpretation over the phone.  Sheppard Evens MD MPH OB Fellow, Faculty Practice [facility], Center for [facility] 05/31/2022

## 2022-05-31 NOTE — Progress Notes (Signed)
Per MD order, Mel Almond disconnected from suction and 90mL sterile fluid removed from cervical seal. Jada device left in place. Pt tolerated well. Will monitor for 1 hour for s/s of bleeding.

## 2022-05-31 NOTE — H&P (Signed)
LABOR AND DELIVERY ADMISSION HISTORY AND PHYSICAL NOTE  Amber Alvarez is a 22 y.o. female G3P1001 with IUP at [redacted]w[redacted]d by L/8 presenting for scheduled primary cesarean.   She reports positive fetal movement. She denies leakage of fluid or vaginal bleeding.  Prenatal History/Complications:  Past Medical History: Past Medical History:  Diagnosis Date   Anemia    Anxiety    with preg, anious about baby   Gestational diabetes    Gestational diabetes mellitus, class A1 02/25/2021   Postpartum A1C was 5.4 % at her PCPs   History of cholelithiasis 02/06/2021   History of gestational diabetes 11/03/2021   Language barrier 02/03/2021   Placenta previa    UTI (urinary tract infection)    UTI (urinary tract infection) during pregnancy, first trimester 12/08/2021   E.coli> treated with Cefadroxil  [X]  toc: neg    Past Surgical History: Past Surgical History:  Procedure Laterality Date   NO PAST SURGERIES      Obstetrical History: OB History     Gravida  3   Para  1   Term  1   Preterm      AB      Living  1      SAB      IAB      Ectopic      Multiple  0   Live Births  1           Social History: Social History   Socioeconomic History   Marital status: Married    Spouse name: Not on file   Number of children: Not on file   Years of education: Not on file   Highest education level: High school graduate  Occupational History   Occupation: unemployed  Tobacco Use   Smoking status: Never    Passive exposure: Never   Smokeless tobacco: Never  Vaping Use   Vaping Use: Never used  Substance and Sexual Activity   Alcohol use: Never   Drug use: Never   Sexual activity: Not Currently    Birth control/protection: None  Other Topics Concern   Not on file  Social History Narrative   Not on file   Social Determinants of Health   Financial Resource Strain: Not on file  Food Insecurity: No Food Insecurity (05/31/2022)   Hunger Vital Sign    Worried About  Running Out of Food in the Last Year: Never true    Ran Out of Food in the Last Year: Never true  Transportation Needs: No Transportation Needs (05/31/2022)   PRAPARE - Administrator, Civil Service (Medical): No    Lack of Transportation (Non-Medical): No  Physical Activity: Not on file  Stress: Not on file  Social Connections: Not on file    Family History: Family History  Problem Relation Age of Onset   Healthy Mother    Liver disease Father        died from pneumonia    Allergies: Allergies  Allergen Reactions   Beef-Derived Products     Not an allergy, halal dietary restriction   Chicken Protein     Pt cannot eat meats that are not Halal certified. This is not an allergy.    Fish-Derived Products     Pt cannot eat meats and seafood that are not Halal certified. This is not an allergy.    Pork-Derived Products     Not an allergy, halal dietary restriction   Singulair [Montelukast]     Helped breathing,  but made her very dizzy    Medications Prior to Admission  Medication Sig Dispense Refill Last Dose   aspirin EC 81 MG tablet Take 1 tablet (81 mg total) by mouth daily. Take after 12 weeks for prevention of preeclampsia later in pregnancy 300 tablet 2 Past Week   Prenatal Vit-Fe Fumarate-FA (PRENATAL VITAMIN PO) Take 1 tablet by mouth daily.   05/30/2022   acetaminophen (TYLENOL) 500 MG tablet Take 1,000 mg by mouth every 8 (eight) hours as needed. Helps HA      ondansetron (ZOFRAN-ODT) 8 MG disintegrating tablet Take 1 tablet (8 mg total) by mouth every 8 (eight) hours as needed for nausea or vomiting. 30 tablet 2      Review of Systems   All systems reviewed and negative except as stated in HPI  Blood pressure 131/87, pulse 84, temperature 98.4 F (36.9 C), temperature source Oral, resp. rate 18, height 5\' 6"  (1.676 m), weight 90.7 kg, SpO2 100 %, unknown if currently breastfeeding. General appearance: alert, cooperative, and appears stated age Lungs:  clear to auscultation bilaterally Heart: regular rate and rhythm Abdomen: soft, non-tender; bowel sounds normal Extremities: No calf swelling or tenderness Presentation: cephalic on yesterday's u/s Fetal monitoring: 130s Uterine activity: quiet     Prenatal labs: ABO, Rh: --/--/AB POS (04/30 0944) Antibody: NEG (04/30 0944) Rubella: 19.50 (11/06 1011) RPR: NON REACTIVE (04/30 0941)  HBsAg: Negative (11/06 1011)  HIV: Non Reactive (03/05 0830)  GBS: Positive/-- (04/26 1126)  2 hr Glucola: abnormal Genetic screening:  wnl Anatomy US: wnl  Prenatal Transfer Tool  Maternal Diabetes: Yes:  Diabetes Type:  Diet controlled Genetic Screening: Normal Maternal Ultrasounds/Referrals: low-lying placenta Fetal Ultrasounds or other Referrals:  None Maternal Substance Abuse:  No Significant Maternal Medications:  None Significant Maternal Lab Results: Group B Strep positive  Results for orders placed or performed during the hospital encounter of 05/31/22 (from the past 24 hour(s))  Glucose, capillary   Collection Time: 05/31/22 10:00 AM  Result Value Ref Range   Glucose-Capillary 80 70 - 99 mg/dL    Patient Active Problem List   Diagnosis Date Noted   Group B Streptococcus carrier, antepartum 05/29/2022   Placenta previa 05/16/2022   Gestational diabetes 04/17/2022   History of macrosomia in infant in prior pregnancy, currently pregnant 03/23/2022   Supervision of high risk pregnancy, antepartum 12/05/2021   Short interval between pregnancies affecting pregnancy, antepartum 12/05/2021   Excessive fetal growth affecting management of mother, antepartum 01/29/2021    Assessment: Amber Alvarez is a 22 y.o. G3P1001 at [redacted]w[redacted]d here for scheduled primary cesarean for low-lying placenta less than 1 cm from internal os. Received MFM counseling yesterday and I re-engaged her in counseling, discussing risks of hemorrhage to mom and baby with vaginal delivery vs risks of cesarean that do include  increased risk of hemorrhage. Patient confirms her desire to proceed with cesarean delivery.   The risks of cesarean section were discussed with the patient including but were not limited to: bleeding which may require transfusion or reoperation; infection which may require antibiotics; injury to bowel, bladder, ureters or other surrounding organs; injury to the fetus; need for additional procedures including hysterectomy in the event of a life-threatening hemorrhage; placental abnormalities wth subsequent pregnancies, incisional problems, thromboembolic phenomenon and other postoperative/anesthesia complications.  Patient declines tubal sterilization.  Patient has been NPO since midnight she will remain NPO for procedure. Anesthesia and OR aware.  Preoperative prophylactic antibiotics and SCDs ordered on call to the OR.  To OR when ready.  # Low-lying placenta: given increased risk of PPH will plan on prophylactic TXA and will utilize the cell-saver device. Have also typed and crossed 2 units.  # feeding: breast/bottle  # MOC: considering paraguard, declines post-placenta, wants to discuss further with her OB provider at her postpartum f/u  # GBS pos: no indication for tx  # A1GDM: fasting glucose appropriate (80). Fetal growth normal  # Circ: yes, but outpatient   Amber Alvarez [facility] 05/31/2022, 10:57 AM

## 2022-05-31 NOTE — Op Note (Signed)
Cesarean Section Operative Report  Amber Alvarez  05/31/2022  Indications: low-lying placenta (less than 1 cm from internal os)  Pre-operative Diagnosis: low-lying placenta, primary low transverse cesarean section  Post-operative Diagnosis: Same   Surgeon: Surgeon(s) and Role:    * Philana Younis, Wilfred Curtis, MD - Primary    * Nobie Putnam, Cyndi Lennert, MD - Fellow   Attending Attestation: I was present and scrubbed for the entire procedure.   An experienced assistant was required given the standard of surgical care given the complexity of the case.  This assistant was needed for exposure, dissection, suctioning, retraction, instrument exchange, assisting with delivery with administration of fundal pressure, and for overall help during the procedure.  Anesthesia: spinal    Quantified Blood Loss: 997 ml. 675 was collected with the cell saver, and 215 ml was transfused back to the patient  Total IV Fluids: 2200 ml LR  Urine Output:: 750 ml clear yellow urine  Specimens: none  Findings: Viable female infant in cephalic presentation; Apgars pending; weight pending; arterial cord pH not obtained;  clear amniotic fluid; intact placenta with three vessel cord; normal uterus, fallopian tubes and ovaries bilaterally.  Baby condition / location:  NICU   Complications: no complications  Indications: [name] is a 22 y.o. [id number] with an IUP [redacted]w[redacted]d presenting for scheduled primary cesarean for low-lying placenta.  The risks, benefits, complications, treatment options, and exected outcomes were discussed with the patient . The patient dwith the proposed plan, giving informed consent. identified as Marlene Beidler and the procedure verified as C-Section Delivery.  Procedure Details:  The patient was taken back to the operative suite where spincal anesthesia was placed.  A time out was held and the above information confirmed.   After induction of anesthesia, the patient was draped and prepped in the usual  sterile manner and placed in a dorsal supine position with a leftward tilt. A Pfannenstiel incision was made and carried down through the subcutaneous tissue to the fascia. Fascial incision was made and bluntly extended transversely. The fascia was separated from the underlying rectus tissue superiorly and inferiorly. The peritoneum was identified and bluntly entered and extended longitudinally. Alexis retractor was placed. A bladder flap was not created. A low transverse uterine incision was made and extended bluntly. Delivered from cephalic presentation was a viable infant with Apgars and weight as above.  After waiting 60 seconds for delayed cord cutting, the umbilical cord was clamped and cut cord blood was obtained for evaluation. Cord ph was not sent. The placenta was removed Intact and appeared normal. The uterine incision was closed with running unlocked sutures 0-Vicryl in one layer with placement of one figure of 8 0 vicryl suture.   Hemostasis was observed. The peritoneum was not closed. The rectus muscles were examined and hemostasis observed. The fascia was then reapproximated with running sutures of 0-Vicryl. The skin was closed with 4-0 Vicryl.  Instrument, sponge, and needle counts were correct prior the abdominal closure and were correct at the conclusion of the case.     Disposition: PACU - hemodynamically stable.   Maternal Condition: stable       Signed: Cherrie Gauze WoukMD 05/31/2022 1:26 PM

## 2022-05-31 NOTE — Anesthesia Postprocedure Evaluation (Signed)
Anesthesia Post Note  Patient: Amber Alvarez  Procedure(s) Performed: CESAREAN SECTION APPLICATION OF CELL SAVER     Patient location during evaluation: PACU Anesthesia Type: Spinal Level of consciousness: oriented and awake and alert Pain management: pain level controlled Vital Signs Assessment: post-procedure vital signs reviewed and stable Respiratory status: spontaneous breathing, respiratory function stable and patient connected to nasal cannula oxygen Cardiovascular status: blood pressure returned to baseline and stable Postop Assessment: no headache, no backache and no apparent nausea or vomiting Anesthetic complications: no  No notable events documented.  Last Vitals:  Vitals:   05/31/22 1530 05/31/22 1542  BP: 131/76 128/81  Pulse: 98 77  Resp: 20   Temp:  36.7 C  SpO2: 98% 100%    Last Pain:  Vitals:   05/31/22 1542  TempSrc: Oral  PainSc:    Pain Goal:                   Aracelys Glade L Nakema Fake

## 2022-05-31 NOTE — Discharge Summary (Signed)
Postpartum Discharge Summary      Patient Name: Amber Alvarez DOB: Dec 24, 2000 MRN: [id number]  Date of admission: 05/31/2022 Delivery date:05/31/2022  Delivering provider: Shonna Chock [place]  Date of discharge: 06/03/2022  Admitting diagnosis: Status post primary low transverse cesarean section [Z98.891] Intrauterine pregnancy: [redacted]w[redacted]d     Secondary diagnosis:  Principal Problem:   Status post primary low transverse cesarean section Active Problems:   Excessive fetal growth affecting management of mother, antepartum   Supervision of high risk pregnancy, antepartum   History of macrosomia in infant in prior pregnancy, currently pregnant   Gestational diabetes   Low-lying placenta   Cesarean delivery delivered   Postpartum hemorrhage  Additional problems: None    Discharge diagnosis: Term Pregnancy Delivered and GDM A2                                              Post partum procedures: None Augmentation: N/A Complications: None  Hospital course: Sceduled C/S   22 y.o. yo G3P2002 at [redacted]w[redacted]d was admitted to the hospital 05/31/2022 for scheduled cesarean section with the following indication:Previa.Delivery details are as follows:  Membrane Rupture Time/Date: 11:54 AM ,05/31/2022   Delivery Method:C-Section, Low Transverse  Details of operation can be found in separate operative note.  Patient had a postpartum course complicated by [facility] with JADA placement. Received TXA and methergine. She is ambulating, tolerating a regular diet, passing flatus, and urinating well. Patient is discharged home in stable condition on  06/03/22        Newborn Data: Birth date:05/31/2022  Birth time:11:54 AM  Gender:Female  Living status:Living  Apgars: ,  Weight:3250 g     Magnesium Sulfate received: No BMZ received: No Rhophylac:N/A MMR:N/A T-DaP:Given prenatally Flu: N/A Transfusion:No  Physical exam  Vitals:   06/02/22 0443 06/02/22 0800 06/02/22 1508 06/02/22 2312  BP: 120/75 120/75 130/84 134/86   Pulse: 79 91 98 85  Resp: 16 17 16 17   Temp: 98 F (36.7 C) 97.8 F (36.6 C) 98.5 F (36.9 C) 98.1 F (36.7 C)  TempSrc: Oral Oral  Oral  SpO2: 98% 99% 97% 97%  Weight:      Height:       General: alert, cooperative, and no distress Lochia: appropriate Uterine Fundus: firm Incision: Healing well with no significant drainage, No significant erythema, Dressing is clean, dry, and intact DVT Evaluation: No evidence of DVT seen on physical exam. Labs: Lab Results  Component Value Date   WBC 12.5 (H) 06/01/2022   HGB 11.4 (L) 06/01/2022   HCT 32.8 (L) 06/01/2022   MCV 90.4 06/01/2022   PLT 211 06/01/2022      Latest Ref Rng & Units 04/17/2022   10:04 AM  CMP  Glucose 70 - 99 mg/dL 82   BUN 6 - 20 mg/dL 5   Creatinine 0.34 - 7.42 mg/dL 5.95   Sodium 638 - 756 mmol/L 134   Potassium 3.5 - 5.1 mmol/L 3.4   Chloride 98 - 111 mmol/L 105   CO2 22 - 32 mmol/L 21   Calcium 8.9 - 10.3 mg/dL 8.4   Total Protein 6.5 - 8.1 g/dL 5.6   Total Bilirubin 0.3 - 1.2 mg/dL 0.2   Alkaline Phos 38 - 126 U/L 78   AST 15 - 41 U/L 20   ALT 0 - 44 U/L 14    [place]  Score:    06/02/2022    5:39 AM  Edinburgh Postnatal Depression Scale Screening Tool  I have been able to laugh and see the funny side of things. 0  I have looked forward with enjoyment to things. 0  I have blamed myself unnecessarily when things went wrong. 0  I have been anxious or worried for no good reason. 0  I have felt scared or panicky for no good reason. 0  Things have been getting on top of me. 0  I have been so unhappy that I have had difficulty sleeping. 0  I have felt sad or miserable. 0  I have been so unhappy that I have been crying. 0  The thought of harming myself has occurred to me. 0  Edinburgh Postnatal Depression Scale Total 0     After visit meds:  Allergies as of 06/03/2022       Reactions   Beef-derived Products    Not an allergy, halal dietary restriction   Chicken Protein    Pt cannot eat  meats that are not Halal certified. This is not an allergy.    Pork-derived Products    Not an allergy, halal dietary restriction   Singulair [montelukast]    Helped breathing, but made her very dizzy        Medication List     STOP taking these medications    aspirin EC 81 MG tablet       TAKE these medications    acetaminophen 500 MG tablet Commonly known as: TYLENOL Take 2 tablets (1,000 mg total) by mouth every 6 (six) hours. What changed:  when to take this reasons to take this additional instructions   ibuprofen 600 MG tablet Commonly known as: ADVIL Take 1 tablet (600 mg total) by mouth every 6 (six) hours.   ondansetron 8 MG disintegrating tablet Commonly known as: ZOFRAN-ODT Take 1 tablet (8 mg total) by mouth every 8 (eight) hours as needed for nausea or vomiting.   oxyCODONE 5 MG immediate release tablet Commonly known as: Oxy IR/ROXICODONE Take 1 tablet (5 mg total) by mouth every 4 (four) hours as needed for moderate pain.   PRENATAL VITAMIN PO Take 1 tablet by mouth daily.         Discharge home in stable condition Infant Feeding: Breast Infant Disposition:home with mother Discharge instruction: per After Visit Summary and Postpartum booklet. Activity: Advance as tolerated. Pelvic rest for 6 weeks.  Diet: routine diet Future Appointments: Future Appointments  Date Time Provider Department Center  07/13/2022  3:55 PM [facility]  07/14/2022  8:20 AM [facility]   Follow up Visit: Message sent   Please schedule this patient for a In person postpartum visit in 6 weeks with the following provider: Any provider. Additional Postpartum F/U:2 hour GTT and Incision check 1 week  High risk pregnancy complicated by: GDM and Previa Delivery mode:  C-Section, Low Transverse  Anticipated Birth Control:   declines    06/03/2022 Celedonio Savage, MD

## 2022-05-31 NOTE — Anesthesia Procedure Notes (Signed)
Spinal  Patient location during procedure: OR Start time: 05/31/2022 11:35 AM End time: 05/31/2022 11:37 AM Reason for block: surgical anesthesia Staffing Performed: anesthesiologist  Anesthesiologist: Elmer Picker, MD Performed by: Elmer Picker, MD Authorized by: Elmer Picker, MD   Preanesthetic Checklist Completed: patient identified, IV checked, risks and benefits discussed, surgical consent, monitors and equipment checked, pre-op evaluation and timeout performed Spinal Block Patient position: sitting Prep: DuraPrep and site prepped and draped Patient monitoring: cardiac monitor, continuous pulse ox and blood pressure Approach: midline Location: L3-4 Injection technique: single-shot Needle Needle type: Pencan  Needle gauge: 24 G Needle length: 9 cm Assessment Sensory level: T6 Events: CSF return Additional Notes Functioning IV was confirmed and monitors were applied. Sterile prep and drape, including hand hygiene and sterile gloves were used. The patient was positioned and the spine was prepped. The skin was anesthetized with lidocaine.  Free flow of clear CSF was obtained prior to injecting local anesthetic into the CSF.  The spinal needle aspirated freely following injection.  The needle was carefully withdrawn.  The patient tolerated the procedure well.

## 2022-05-31 NOTE — Anesthesia Preprocedure Evaluation (Signed)
Anesthesia Evaluation  Patient identified by MRN, date of birth, ID band Patient awake    Reviewed: Allergy & Precautions, NPO status , Patient's Chart, lab work & pertinent test results  Airway Mallampati: II  TM Distance: >3 FB Neck ROM: Full    Dental no notable dental hx.    Pulmonary neg pulmonary ROS   Pulmonary exam normal breath sounds clear to auscultation       Cardiovascular negative cardio ROS Normal cardiovascular exam Rhythm:Regular Rate:Normal     Neuro/Psych  PSYCHIATRIC DISORDERS Anxiety     negative neurological ROS     GI/Hepatic negative GI ROS, Neg liver ROS,,,  Endo/Other  diabetes, Gestational    Renal/GU negative Renal ROS  negative genitourinary   Musculoskeletal negative musculoskeletal ROS (+)    Abdominal   Peds  Hematology negative hematology ROS (+)   Anesthesia Other Findings Primary C/S for placenta previa  Reproductive/Obstetrics (+) Pregnancy                             Anesthesia Physical Anesthesia Plan  ASA: 3  Anesthesia Plan: Spinal   Post-op Pain Management:    Induction:   PONV Risk Score and Plan: Treatment may vary due to age or medical condition  Airway Management Planned: Natural Airway  Additional Equipment:   Intra-op Plan:   Post-operative Plan:   Informed Consent: I have reviewed the patients History and Physical, chart, labs and discussed the procedure including the risks, benefits and alternatives for the proposed anesthesia with the patient or authorized representative who has indicated his/her understanding and acceptance.     Dental advisory given  Plan Discussed with: CRNA  Anesthesia Plan Comments:        Anesthesia Quick Evaluation

## 2022-05-31 NOTE — Progress Notes (Addendum)
Called to post-op for bleeding.  Patient is a 22 yo g2P2002 s/p pltcs earlier today for low-lying placenta. Anticipating PPH risk, txa was given prophylactically prior to section and cell saver was used. QBL at end of case 80 though this seems quite high and her EBL at the time of surgery appears to be normal. 215 ml was transfused back to the patient from the cell saver. Good uterine tone noted during surgery and there were no complications. In post-op fundus firm but steady trickle and clots with fundal rub. This is not surprising given the low-lying placenta and relatively poor contractility of the lower uterine segment. At the time of my eval vitals are normal and patient feels well. I ordered a second dose of tranexamic acid as well as a dose of methergine. Patient has a foley catheter and uop is good, 1100 since it was placed at the start of surgery. We bolused a liter and I placed the Jada device and placed to wall suction. Also ordered a cbc and DIC panel (the latter mainly to establish a baseline, do not see signs of DIC at this time). Upon re-evaluation 40 minutes after placement of jada vitals remain stable, no significant bleeding from the vagina, and blood has almost but not yet reached the canister. QBL at this time is 1365. Will maintain Jada for now, see about discontinuing later this afternoon.   Addendum 16:40 Bleeding appears to have ceased, no additional return from the JADA for the past 1.5 hours. Will discontinue suctioning and deflate, monitor one hour, and hopefully remove after that.

## 2022-06-01 ENCOUNTER — Encounter (HOSPITAL_COMMUNITY): Payer: Self-pay | Admitting: Obstetrics and Gynecology

## 2022-06-01 LAB — CBC
HCT: 32.8 % — ABNORMAL LOW (ref 36.0–46.0)
Hemoglobin: 11.4 g/dL — ABNORMAL LOW (ref 12.0–15.0)
MCH: 31.4 pg (ref 26.0–34.0)
MCHC: 34.8 g/dL (ref 30.0–36.0)
MCV: 90.4 fL (ref 80.0–100.0)
Platelets: 211 10*3/uL (ref 150–400)
RBC: 3.63 MIL/uL — ABNORMAL LOW (ref 3.87–5.11)
RDW: 13.5 % (ref 11.5–15.5)
WBC: 12.5 10*3/uL — ABNORMAL HIGH (ref 4.0–10.5)
nRBC: 0 % (ref 0.0–0.2)

## 2022-06-01 NOTE — Progress Notes (Signed)
POSTPARTUM PROGRESS NOTE  POD #1  Subjective:  Amber Alvarez is a 22 y.o. G3P2002 s/p pLTCS at 102w0d.  She reports she doing well. No acute events overnight. She reports she is doing well. She denies any problems with ambulating, voiding or po intake. Denies nausea or vomiting. She has not passed flatus. Pain is moderately controlled.  Lochia is appropriate.  Objective: Blood pressure 110/67, pulse 79, temperature 98 F (36.7 C), temperature source Oral, resp. rate 16, height 5\' 6"  (1.676 m), weight 90.7 kg, SpO2 100 %, unknown if currently breastfeeding.  Physical Exam:  General: alert, cooperative and no distress Chest: no respiratory distress Heart:regular rate, distal pulses intact Abdomen: soft, nontender,  Uterine Fundus: firm, appropriately tender DVT Evaluation: No calf swelling or tenderness Extremities: No LE edema Skin: warm, dry; w/ pressure dressing in place  Recent Labs    05/31/22 1417 06/01/22 0414  HGB 13.8 11.4*  HCT 42.8 32.8*    Assessment/Plan: Amber Alvarez is a 22 y.o. G3P2002 s/p pLTCS at [redacted]w[redacted]d for low lying placenta.  POD#1 - Doing well; pain moderately controlled. H/H appropriate  Routine postpartum care  OOB, ambulated  Lovenox for VTE prophylaxis Anemia: asymptomatic Contraception: Paraguard outpt Feeding: Both  Dispo: Plan for discharge 5/3-5/4.   LOS: 1 day   Lavonda Jumbo, DO OB Fellow, Faculty Practice [facility], Center for [facility] 06/01/2022, 4:26 PM

## 2022-06-01 NOTE — Lactation Note (Signed)
This note was copied from a baby's chart.  NICU Lactation Consultation Note  Patient Name: Amber Alvarez ZOXWR'U Date: 06/01/2022 Age:22 years  Reason for consult: Initial assessment; NICU baby; Early term 37-38.6wks; Maternal endocrine disorder Type of Endocrine Disorder?: Diabetes (GDMA1)  SUBJECTIVE Visited with family of 81 58/5 weeks old NICU female; Ms. Amber Alvarez is a P2 and speaks "Urdu/[place]"; her husband has signed off as her interpreter; he was present at the time of [facility] consultation. He reported they have previously declined lactation services, but it wasn't documented in the chart prior this visit. OB Specialty care RN Baxter Hire voiced that when patient was transported from the first floor she had a pump kit that was never opened, the kit wasn't transported either. Pump has not been set up in the couplet care room, but FOB voiced MOB will be interested in pumping at some point, encouraged her to start ASAP and briefly provided education about the prevention of engorgement; RN Kristen notified. Lactation services are completed at this time, but family is aware that they can request services if they change their mind.   OBJECTIVE Infant data: Mother's Current Feeding Choice: Breast Milk and Formula  Maternal data: G3P2002  C-Section, Low Transverse  ASSESSMENT Infant: Feeding Status: NPO  INTERVENTIONS/PLAN Interventions: Education  Plan: Consult Status: Complete   Allyn Bartelson S Fredrick Ge[place]hegan 06/01/2022, 10:37 AM

## 2022-06-01 NOTE — Progress Notes (Signed)
Patient screened out for psychosocial assessment since none of the following apply:  Psychosocial stressors documented in mother or baby's chart  Gestation less than 32 weeks  Code at delivery   Infant with anomalies Please contact the Clinical Social Worker if specific needs arise, by MOB's request, or if MOB scores greater than 9/yes to question 10 on Edinburgh Postpartum Depression Screen.  Kambra Beachem, LCSW Clinical Social Worker [facility] Cell#: [phone]     

## 2022-06-03 ENCOUNTER — Ambulatory Visit (HOSPITAL_COMMUNITY): Payer: Self-pay

## 2022-06-03 DIAGNOSIS — Z8759 Personal history of other complications of pregnancy, childbirth and the puerperium: Secondary | ICD-10-CM | POA: Insufficient documentation

## 2022-06-03 HISTORY — DX: Personal history of other complications of pregnancy, childbirth and the puerperium: Z87.59

## 2022-06-03 MED ORDER — OXYCODONE HCL 5 MG PO TABS: 5.0000 mg | ORAL_TABLET | ORAL | 0 refills | Status: DC | PRN

## 2022-06-03 MED ORDER — ACETAMINOPHEN 500 MG PO TABS: 1000.0000 mg | ORAL_TABLET | Freq: Four times a day (QID) | ORAL | 0 refills | Status: AC

## 2022-06-03 MED ORDER — IBUPROFEN 600 MG PO TABS: 600.0000 mg | ORAL_TABLET | Freq: Four times a day (QID) | ORAL | 0 refills | Status: DC

## 2022-06-03 NOTE — Lactation Note (Signed)
This note was copied from a baby's chart.  NICU Lactation Consultation Note  Patient Name: Boy Breta Lobner ZOXWR'U Date: 06/03/2022 Age:23 hours  Reason for consult: Follow-up assessment; NICU baby; Early term 1-38.6wks; Mother's request; Maternal endocrine disorder; Other (Comment) ([employer] employee) Type of Endocrine Disorder?: Diabetes (GDMA1)  SUBJECTIVE Visited with family of 23 hours old ETI NICU female; Ms. Noel is a P2 and reports she's been pumping and her supply has started to slowly increase, praised her for her efforts. Patient prefers to use FOB as an interpreter for "Urdu/[place]"; he was the interpreter for this encounter. Parents called out for lactation because Ms. Gonalez is getting discharged today and they needed a pump for home use, he's a [employer] employee. He explained that the plan is to eventually directly breastfeed baby but that MOB is shy/self-conscious about having LCs or staff members watching her breastfeeding; that's why they declined lactation on initial consult. Reviewed discharge education, lactogenesis II/III, options for home pumps and anticipatory guidelines. FOB will research insurance pumps on the website before deciding on one; he has the form and will call out for lactation again if they decide to do the employee pump directly through the hospital.   OBJECTIVE Infant data: Mother's Current Feeding Choice: Breast Milk and Formula  Infant feeding assessment Scale for Readiness: 2   Maternal data: G3P2002  C-Section, Low Transverse Has patient been taught Hand Expression?: Yes Hand Expression Comments: Colostrum noted Significant Breast History:: (+) breast changes during the pregnancy Current breast feeding challenges:: NICU admission Does the patient have breastfeeding experience prior to this delivery?: Yes How long did the patient breastfeed?: while at the hospital, she felt sefl-concious about having the staff (lactation consultants) watching her  breastfeeding Pumping frequency: 4-5 times/24 hours Pumped volume: 10 mL (10-20 ml) Flange Size: 24 Risk factor for low milk supply:: infant separation, late onset of pumping  Pump:  (FOB has employee pump form, he's a [employer] Employee)  ASSESSMENT Infant: Feeding Status: Scheduled 8-11-2-5  Maternal: Milk volume: Normal  INTERVENTIONS/PLAN Interventions: Interventions: Breast feeding basics reviewed; Coconut oil; DEBP; Education; [employer] brochure Discharge Education: Engorgement and breast care Tools: Pump; Flanges Pump Education: Setup, frequency, and cleaning; Milk Storage  Plan: Encouraged pumping every 3 hours, ideally 8 pumping sessions/24 hours Parents to call out for latch assistance if she decides taking baby to breast while at the hospital  FOB present and supportive. All questions and concerns answered, family to contact [facility] services PRN.  Consult Status: PRN NICU Follow-up type: Baby's discharge   Paradise Vensel S Maysen Sudol 06/03/2022, 1:10 PM

## 2022-06-05 ENCOUNTER — Ambulatory Visit (HOSPITAL_COMMUNITY): Payer: Self-pay

## 2022-06-05 NOTE — Lactation Note (Addendum)
This note was copied from a baby's chart.  NICU Lactation Consultation Note  Patient Name: Amber Alvarez [id number] Date: 06/05/2022 Age:23 days  Reason for consult: Follow-up assessment; NICU baby; Early term 65-38.6wks; Maternal endocrine disorder Type of Endocrine Disorder?: Diabetes  SUBJECTIVE  LC in to visit with P2 Mom of ET infant in the NICU.  Baby is in room air and doing well.  SLP to consult at 2pm feeding to assess if baby can po.  Mom has been pumping, but not consistently, but last pumping, she expressed 100 ml.    Mom's desire is to breastfeed baby, but has difficulty breastfeeding in front of people.  FOB states she is fine if baby receives bottle today to make sure baby can SSB safely.  After that, she will try to breastfeed baby independently.  With her first baby, she struggled as baby had bottles in the hospital and ended up preferring bottles.  Talked to parents regarding baby going to the breast first.  Offered to help with positioning and latching without exposing Mom too much.  Parents are appreciative and will think about it.  FOB states he is not sure about obtaining a Medela pump for home use with his employee insurance.  LC submitted a STORK pump request to see if this would work to obtain a [employer].  OBJECTIVE Infant data:  Infant feeding assessment Scale for Readiness: 2   Maternal data: G3P2002  C-Section, Low Transverse Pumping frequency: every 3 hrs Pumped volume: 100 mL Flange Size: 24  WIC Program: No WIC Referral Sent?: No Pump:  (don't want a Medela Employee pump, submitted a STORK request)  ASSESSMENT Infant: Feeding Status: Scheduled 8-11-2-5  Maternal: Milk volume: Normal  INTERVENTIONS/PLAN Interventions: Interventions: Skin to skin; Breast massage; Hand express; DEBP; Education Tools: Pump; Flanges Pump Education: Setup, frequency, and cleaning; Milk Storage  Plan: Consult Status: NICU follow-up NICU Follow-up type: Verify  absence of engorgement; Verify onset of copious milk   Amber Alvarez 06/05/2022, 1:30 PM

## 2022-06-08 DIAGNOSIS — Z3483 Encounter for supervision of other normal pregnancy, third trimester: Secondary | ICD-10-CM | POA: Diagnosis not present

## 2022-06-08 DIAGNOSIS — Z3482 Encounter for supervision of other normal pregnancy, second trimester: Secondary | ICD-10-CM | POA: Diagnosis not present

## 2022-06-12 ENCOUNTER — Other Ambulatory Visit: Payer: Self-pay

## 2022-06-12 ENCOUNTER — Ambulatory Visit (INDEPENDENT_AMBULATORY_CARE_PROVIDER_SITE_OTHER): Payer: 59 | Admitting: *Deleted

## 2022-06-12 VITALS — BP 113/72 | HR 89 | Wt 177.9 lb

## 2022-06-12 DIAGNOSIS — Z4889 Encounter for other specified surgical aftercare: Secondary | ICD-10-CM

## 2022-06-12 NOTE — Progress Notes (Signed)
Here for Blood pressure and incision check s/p C/S 05/31/22. Patient prefers to use husband as interpreter, has signed release. BP wnl. Incision CDI. Reviewed wound care and precautions. Reviewed postpartum appointment and gtt appointment. She voices understanding. Nancy Fetter

## 2022-06-20 ENCOUNTER — Ambulatory Visit (INDEPENDENT_AMBULATORY_CARE_PROVIDER_SITE_OTHER): Payer: 59

## 2022-06-20 ENCOUNTER — Other Ambulatory Visit: Payer: Self-pay

## 2022-06-20 VITALS — BP 108/67 | HR 87 | Wt 178.4 lb

## 2022-06-20 DIAGNOSIS — B369 Superficial mycosis, unspecified: Secondary | ICD-10-CM

## 2022-06-20 DIAGNOSIS — Z5189 Encounter for other specified aftercare: Secondary | ICD-10-CM

## 2022-06-20 MED ORDER — CLOTRIMAZOLE 1 % EX CREA
1.0000 | TOPICAL_CREAM | Freq: Two times a day (BID) | CUTANEOUS | 0 refills | Status: AC
Start: 2022-06-20 — End: 2022-07-04

## 2022-06-20 NOTE — Progress Notes (Signed)
Incision Check Visit  Amber Alvarez is here for incision check following primary c-section on 05/31/22. Patient's husband interpreting today's visit; interpreter release previously signed. Incision is fully intact, appears moist. Reddened, patchy areas to surrounding area. Pt reports redness accompanied with burning beginning yesterday. Ajewole MD to beside for assessment. Recommends clotrimazole cream BID for 2 weeks. If no improvement within 24-48 hours patient will call office. May need antibiotics for possible celulitis at that time. Reviewed good wound care and s/s of infection with patient.   Patient will follow up at post partum visit  or before if needed.  Marjo Bicker, RN 06/20/2022  10:25 AM

## 2022-07-13 ENCOUNTER — Ambulatory Visit (INDEPENDENT_AMBULATORY_CARE_PROVIDER_SITE_OTHER): Payer: 59 | Admitting: Advanced Practice Midwife

## 2022-07-13 ENCOUNTER — Encounter: Payer: Self-pay | Admitting: Advanced Practice Midwife

## 2022-07-13 ENCOUNTER — Other Ambulatory Visit: Payer: Self-pay

## 2022-07-13 VITALS — BP 120/81 | HR 63 | Ht 64.0 in | Wt 178.0 lb

## 2022-07-13 DIAGNOSIS — Z8632 Personal history of gestational diabetes: Secondary | ICD-10-CM

## 2022-07-13 DIAGNOSIS — Z603 Acculturation difficulty: Secondary | ICD-10-CM | POA: Diagnosis not present

## 2022-07-13 DIAGNOSIS — Z975 Presence of (intrauterine) contraceptive device: Secondary | ICD-10-CM

## 2022-07-13 DIAGNOSIS — Z98891 History of uterine scar from previous surgery: Secondary | ICD-10-CM

## 2022-07-13 DIAGNOSIS — Z758 Other problems related to medical facilities and other health care: Secondary | ICD-10-CM

## 2022-07-13 DIAGNOSIS — Z30430 Encounter for insertion of intrauterine contraceptive device: Secondary | ICD-10-CM

## 2022-07-13 LAB — POCT PREGNANCY, URINE: Preg Test, Ur: NEGATIVE

## 2022-07-13 MED ORDER — PARAGARD INTRAUTERINE COPPER IU IUD
1.0000 | INTRAUTERINE_SYSTEM | Freq: Once | INTRAUTERINE | Status: AC
Start: 2022-07-13 — End: 2022-07-13
  Administered 2022-07-13: 1 via INTRAUTERINE

## 2022-07-13 NOTE — Progress Notes (Signed)
Post Partum Visit Note  Amber Alvarez is a 22 y.o. G24P2002 female who presents for a postpartum visit. She is 6 weeks postpartum following a primary cesarean section.  I have fully reviewed the prenatal and intrapartum course. The delivery was at 37 gestational weeks.  Anesthesia: spinal. Postpartum course has been uncomplicated. Baby is doing well. Baby is feeding by bottle - Enfamil Neuropro . Bleeding no bleeding. Bowel function is normal. Bladder function is normal. Patient is not sexually active. Contraception method is none. Patient is interested in Paragard. Postpartum depression screening: negative.   Upstream - 07/13/22 2007       Pregnancy Intention Screening   Does the patient want to become pregnant in the next year? No    Does the patient's partner want to become pregnant in the next year? No    Would the patient like to discuss contraceptive options today? Yes      Contraception Wrap Up   Current Method Abstinence    End Method IUD or IUS    Contraception Counseling Provided Yes    How was the end contraceptive method provided? Provided on site            The pregnancy intention screening data noted above was reviewed. Potential methods of contraception were discussed. The patient elected to proceed with IUD or IUS.   Edinburgh Postnatal Depression Scale - 07/13/22 1639       Edinburgh Postnatal Depression Scale:  In the Past 7 Days   I have been able to laugh and see the funny side of things. 0    I have looked forward with enjoyment to things. 0    I have blamed myself unnecessarily when things went wrong. 1    I have been anxious or worried for no good reason. 1    I have felt scared or panicky for no good reason. 0    Things have been getting on top of me. 0    I have been so unhappy that I have had difficulty sleeping. 0    I have felt sad or miserable. 0    I have been so unhappy that I have been crying. 0    The thought of harming myself has occurred to  me. 0    Edinburgh Postnatal Depression Scale Total 2             Health Maintenance Due  Topic Date Due   COVID-19 Vaccine (1) Never done   HPV VACCINES (1 - 2-dose series) Never done    The following portions of the patient's history were reviewed and updated as appropriate: allergies, current medications, past family history, past medical history, past social history, past surgical history, and problem list.  Review of Systems Pertinent items are noted in HPI.  Objective:  BP 120/81   Pulse 63   Ht 5\' 4"  (1.626 m)   Wt 178 lb (80.7 kg)   Breastfeeding No   BMI 30.55 kg/m    General:  alert, cooperative, appears stated age, and no distress   Breasts:  not indicated  Lungs: Nml rate and effort  Heart:  Nml rate  Abdomen: Soft, NT. Fundus nonpalpable  Wound well approximated incision, NT. No drainage, erythema or swelling.   GU exam:  NEFG. Small amount of physiologic discharge. Cervix pink and smooth, nml position. Uterus not enlarged, anteverted, NT.       Patient identified, informed consent performed, signed copy in chart, time out was performed.  Urine pregnancy test negative.  Speculum placed in the vagina.  Cervix visualized.  Cleaned with Betadine x 2.  Grasped anteriorly with a single tooth tenaculum.  Uterus sounded to 8cm.  Paragard IUD placed per manufacturer's recommendations.  Strings trimmed to 3 cm. Pt tolerated procedure well.  Patient given post procedure instructions and IUD care card with expiration date.  Patient is asked to check IUD strings periodically and follow up in 4-6 weeks for IUD check. OTC IBU PRN.    Assessment:   1. Encounter for insertion of copper intrauterine contraceptive device (IUD) - OTC PRN - paragard intrauterine copper IUD 1 each  2. Postpartum care and examination - Nml exam  3. History of gestational diabetes - Needs to reschedule PP GTT. In-basket message sent to front office.   4. History of cesarean delivery -  Healing well  5. IUD (intrauterine device) in place  6. Language barrier - Pt previously singed from to have spouse interpret. Spouse interpreted  Plan:   Essential components of care per ACOG recommendations:  1.  Mood and well being: Patient with negative depression screening today. Reviewed local resources for support.  - Patient tobacco use? No.   - hx of drug use? No.    2. Infant care and feeding:  -Patient currently breastmilk feeding? No.  -Social determinants of health (SDOH) reviewed in EPIC. No concerns.  3. Sexuality, contraception and birth spacing - Patient does not want a pregnancy in the next year.  Desired family size is 3 children.  - Reviewed reproductive life planning. Reviewed contraceptive methods based on pt preferences and effectiveness.  Patient desired IUD or IUS today.   - Discussed birth spacing of 18 months  4. Sleep and fatigue -Encouraged family/partner/community support of 4 hrs of uninterrupted sleep to help with mood and fatigue  5. Physical Recovery  - Discussed patients delivery and complications. She describes her labor as NA--scheduled C/S for LL placenta - Patient had a C-section, no problems at delivery. QBL ~900. Jada used.  - Patient has urinary incontinence? No. - Patient is safe to resume physical and sexual activity  6.  Health Maintenance - HM due items addressed No - NA - Last pap smear  Diagnosis  Date Value Ref Range Status  03/30/2021   Final   - Negative for intraepithelial lesion or malignancy (NILM)   Pap smear not done at today's visit.  -Breast Cancer screening indicated? No.   7. Chronic Disease/Pregnancy Condition follow up: Gestational Diabetes  - PCP follow up  Dorathy Kinsman, CNM [facility] [name], [facility]

## 2022-07-13 NOTE — Patient Instructions (Signed)
IUD AFTERCARE INSTRUCTIONS   Warning Signs  Call the clinic if any of the following occurs:   Severe abdominal pain or cramping   Unusual bleeding   Fever or chills   Foul smelling vaginal discharge   Painful intercourse   Positive pregnancy test.  1. Uterine cramping is common after IUD placement. You can help relieve the discomfort  with heating pads, Tylenol (acetaminophen), Aspirin or Advil (ibuprofen). If your  cramping becomes very painful, please call the clinic.   2. Irregular bleeding and spotting is normal for the first few months after the IUD is placed.  In some cases, women may experience irregular bleeding or spotting for up to six months  after the IUD is placed. This bleeding can be annoying at first but usually will become  lighter with the Mirena/Liletta IUD quickly. Call the clinic if your bleeding is excessive and not  getting better.   3. Your period will likely be shorter and lighter with a Mirena IUD. Approximately 40% of  women will stop having periods altogether with the Mirena/Liletta IUD. Your period may be  heavier and longer with the Paragard IUD.   4. IUDs do not protect against sexually transmitted infections including the AIDS virus  (HIV), warts (HPV), gonorrhea, Chlamydia, and herpes. Condoms should be used to  decrease the risk sexually transmitted infections. If you think that you have been exposed  to a sexually transmitted infection, please call the clinic.   5. If you had the IUD placed for birth control, the Paragard IUD is effective immediately.  The Mirena/Liletta IUD is effective immediately if it was inserted within seven days after the  start of your period. If you have Mirena/Liletta inserted at any other time during your menstrual  cycle, use another method of birth control, like condoms for at least 7 days.   6. It is possible for the IUD to come out of the uterus. If it does slip out of place, it is most  likely to happen in the  first few months after being put in. To make sure your IUD is in  place, you can feel for the IUD strings between periods. To check for strings, wash your  hands. Then, sit or squat down. Place one finger into your vagina until you feel your  cervix. It will feel hard and rubbery, like the end of your nose. The string ends should be  coming through your cervix. Do not pull on the strings. If the strings feel much longer  than before, if you feel the hard plastic part of the IUD, or if you cannot feel the strings at  all, the IUD may have moved out of place. Please call the clinic and consider using a  back up form of birth control until you are seen.   7. Keep your follow-up appointment for 4-6 weeks after the IUD has been placed.   8. Pregnancy is unlikely after IUD placement, but can happen. If you have early pregnancy  symptoms like nausea and vomiting, breast tenderness, frequent urination or abdominal  pain, you can take a pregnancy test. Please call the clinic if you have any concerns or if  your pregnancy test is positive.   9. The IUD should only be removed by a healthcare provider.  The Mirena/Liletta IUD should be removed and/or replaced after 8 years.  The Paragard IUD should be removed and/or replaced after 10 years.   

## 2022-07-14 ENCOUNTER — Other Ambulatory Visit: Payer: 59

## 2022-08-21 IMAGING — US US MFM OB FOLLOW-UP
1 series · 14 of 28 positions shown · non-contrast
Comparison: none

[Series 1: us mfm ob follow-up · 50 acquisitions, 14 frames shown]
[im 2/50]
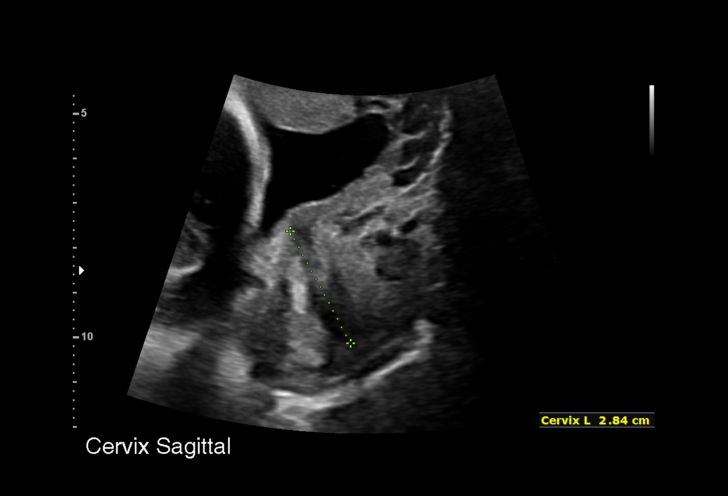
[im 6/50]
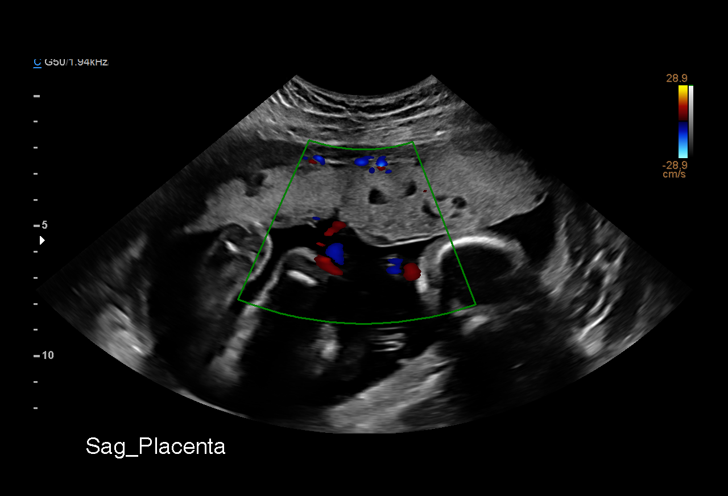
[im 10/50]
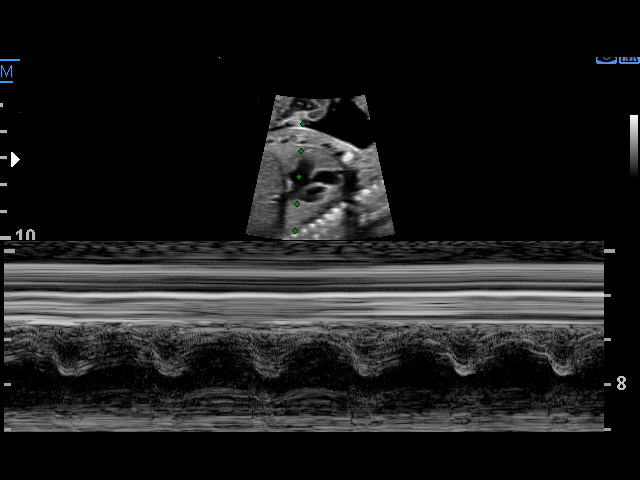
[im 13/50]
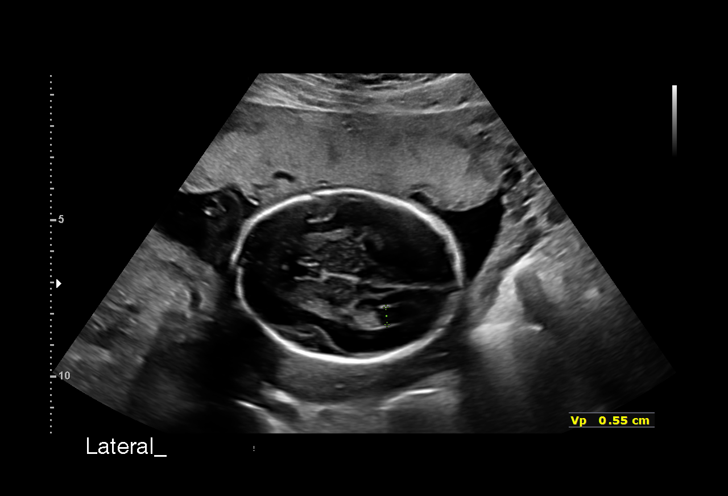
[im 17/50]
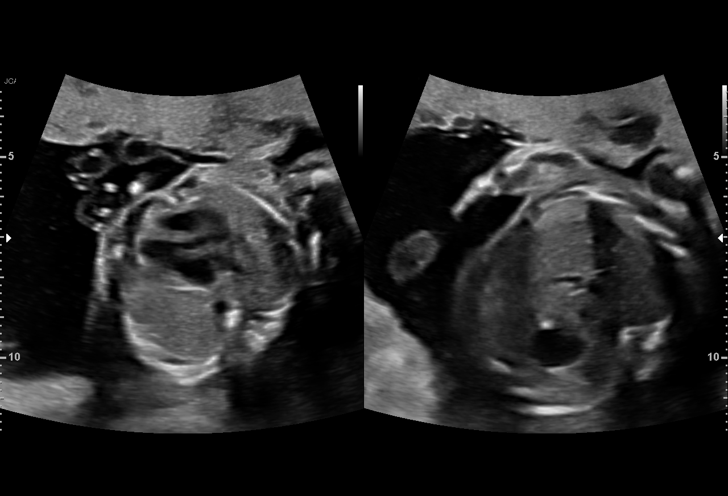
[im 20/50]
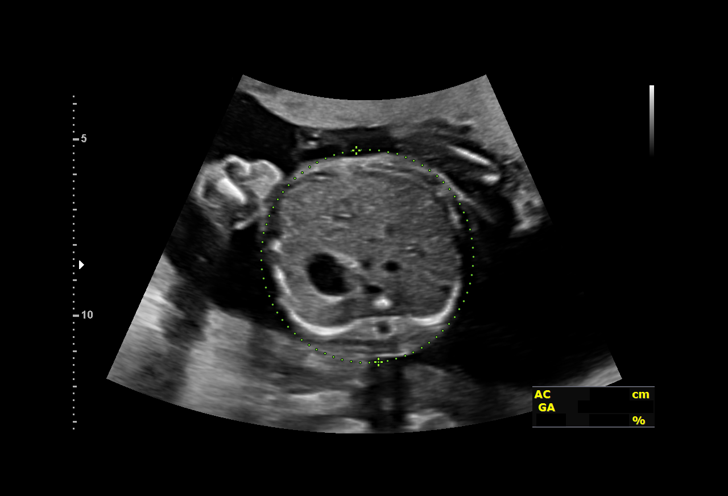
[im 24/50]
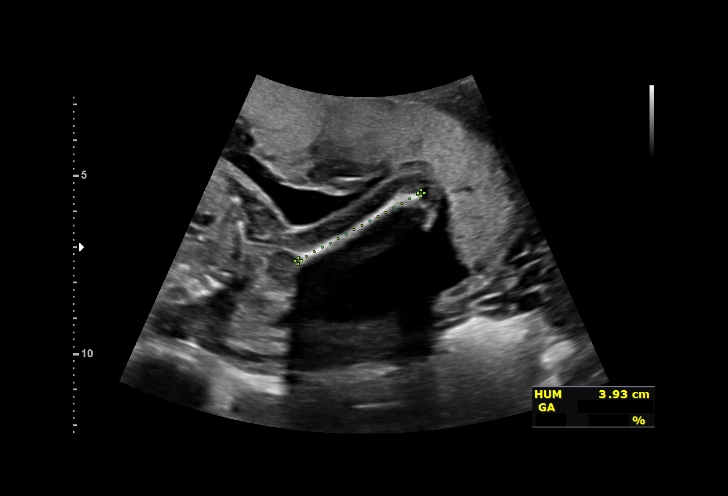
[im 28/50]
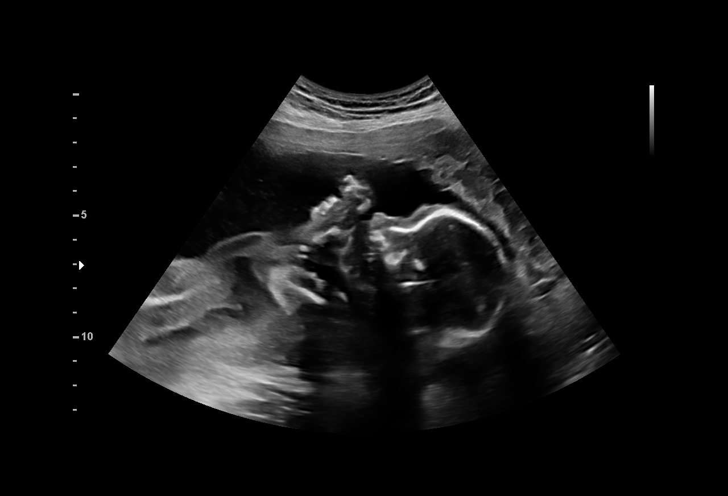
[im 31/50]
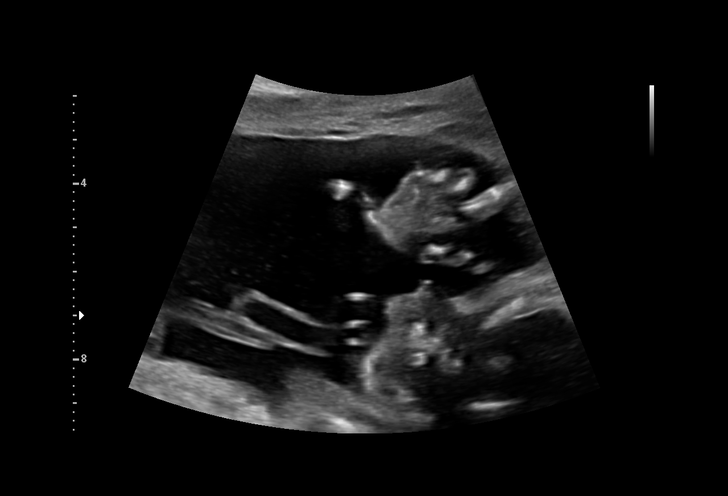
[im 35/50]
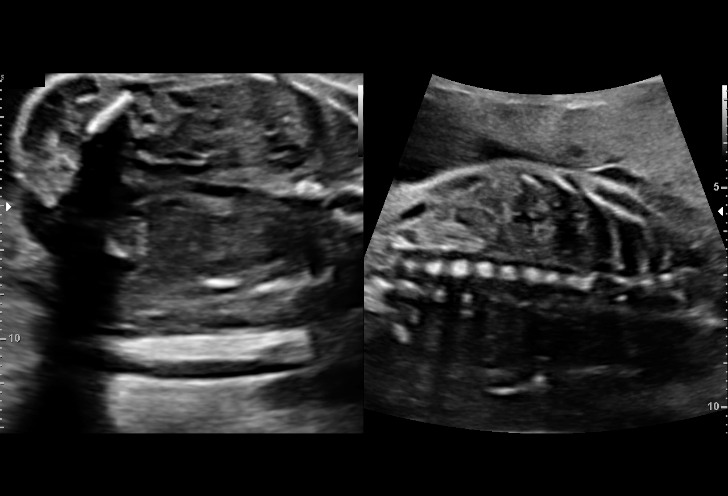
[im 39/50]
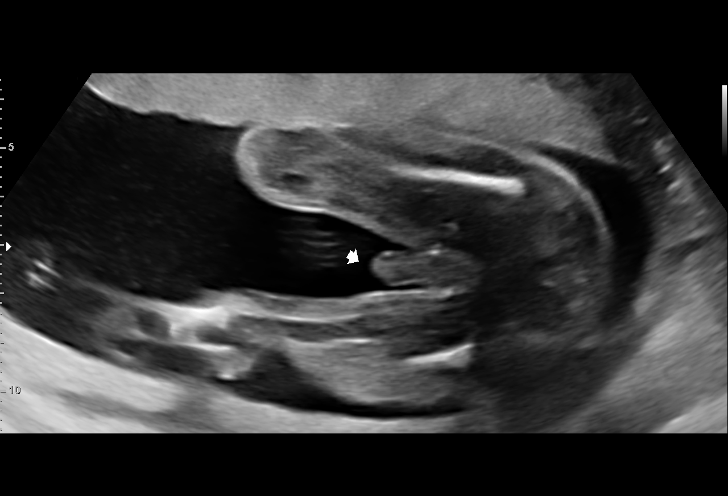
[im 42/50]
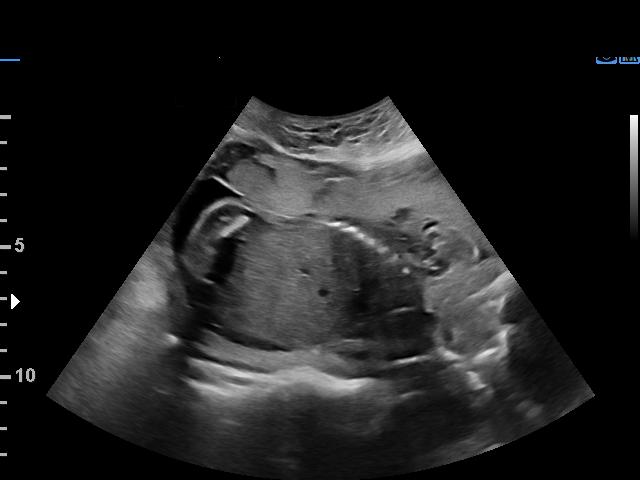
[im 46/50]
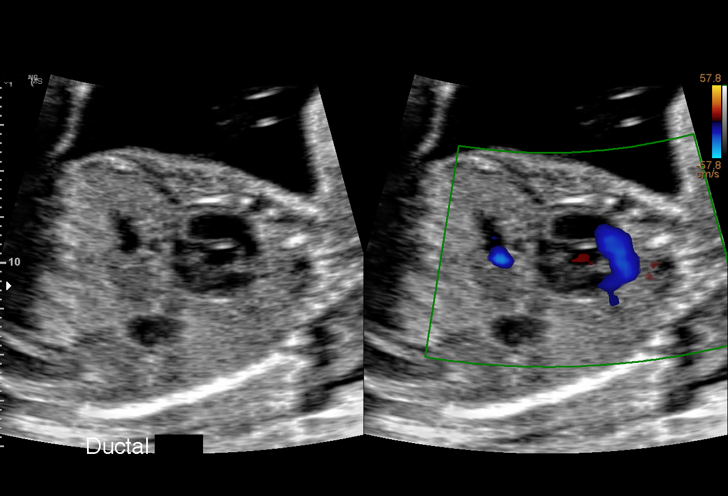
[im 50/50]
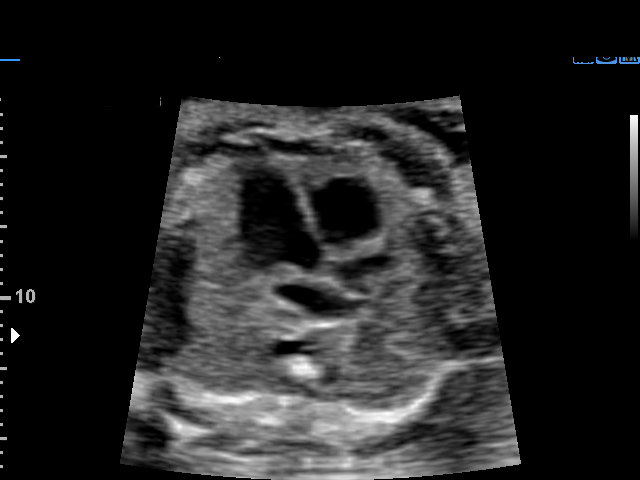

[14 of 28 positions shown; findings below may reference images not displayed]

Indications

 Fetal abnormality - other known or suspected
 23 weeks gestation of pregnancy
 Antenatal screening for malformations
 Encounter for uncertain dates
 Neg AFP
Fetal Evaluation

 Num Of Fetuses:         1
 Fetal Heart Rate(bpm):  150
 Cardiac Activity:       Observed
 Presentation:           Cephalic
 Placenta:               Anterior
 P. Cord Insertion:      Visualized

 Amniotic Fluid
 AFI FV:      Within normal limits

                             Largest Pocket(cm)

Biometry

 BPD:      54.7  mm     G. Age:  22w 5d         32  %    CI:        68.31   %    70 - 86
                                                         FL/HC:      20.0   %    19.2 -
 HC:      211.6  mm     G. Age:  23w 2d         44  %    HC/AC:      1.10        1.05 -
 AC:      191.6  mm     G. Age:  23w 6d         70  %    FL/BPD:     77.3   %    71 - 87
 FL:       42.3  mm     G. Age:  23w 6d         65  %    FL/AC:      22.1   %    20 - 24
 HUM:      39.6  mm     G. Age:  24w 1d         68  %
 LV:        5.5  mm

 Est. FW:     623  gm      1 lb 6 oz     78  %
OB History

 Gravidity:    1         Term:   0        Prem:   0        SAB:   0
 TOP:          0       Ectopic:  0        Living: 0
Gestational Age

 LMP:           23w 6d        Date:  05/22/20                 EDD:   02/26/21
 U/S Today:     23w 3d                                        EDD:   03/01/21
 Best:          23w 0d     Det. By:  U/S  (10/01/20)          EDD:   03/04/21
Anatomy

 Cranium:               Appears normal         Aortic Arch:            Previously seen
 Cavum:                 Previously seen        Ductal Arch:            Previously seen
 Ventricles:            Appears normal         Diaphragm:              Appears normal
 Choroid Plexus:        Previously seen        Stomach:                Appears normal, left
                                                                       sided
 Cerebellum:            Previously seen        Abdomen:                Previously seen
 Posterior Fossa:       Previously seen        Abdominal Wall:         Previously seen
 Nuchal Fold:           Not applicable (>20    Cord Vessels:           Previously seen
                        wks GA)
 Face:                  Orbits and profile     Kidneys:                Appear normal
                        previously seen
 Lips:                  Previously seen        Bladder:                Appears normal
 Thoracic:              Appears normal         Spine:                  Previously seen
 Heart:                 Appears normal         Upper Extremities:      Previously seen
                        (4CH, axis, and
                        situs)
 RVOT:                  Appears normal         Lower Extremities:      Previously seen
 LVOT:                  Appears normal
Cervix Uterus Adnexa

 Cervix
 Length:           2.84  cm.
 Normal appearance by transabdominal scan.
Comments

 This patient was seen for a follow up exam to confirm her
 dates.  She denies any problems since her last exam.
 She was informed that the fetal growth and amniotic fluid
 level appears appropriate for her gestational age.  The fetal
 biometry measurements obtained today are consistent with a
 EDC March 04, 2021.
 As her dates are confirmed, no further exams were
 scheduled in our office.

## 2022-08-24 DIAGNOSIS — N939 Abnormal uterine and vaginal bleeding, unspecified: Secondary | ICD-10-CM | POA: Diagnosis not present

## 2022-08-24 DIAGNOSIS — R5383 Other fatigue: Secondary | ICD-10-CM | POA: Diagnosis not present

## 2022-08-30 ENCOUNTER — Ambulatory Visit: Payer: 59 | Admitting: Obstetrics and Gynecology

## 2022-09-12 DIAGNOSIS — Z13220 Encounter for screening for lipoid disorders: Secondary | ICD-10-CM | POA: Diagnosis not present

## 2022-09-12 DIAGNOSIS — Z0000 Encounter for general adult medical examination without abnormal findings: Secondary | ICD-10-CM | POA: Diagnosis not present

## 2022-09-12 DIAGNOSIS — G44209 Tension-type headache, unspecified, not intractable: Secondary | ICD-10-CM | POA: Diagnosis not present

## 2022-09-25 ENCOUNTER — Ambulatory Visit: Payer: 59 | Admitting: Family Medicine

## 2022-09-28 ENCOUNTER — Ambulatory Visit (INDEPENDENT_AMBULATORY_CARE_PROVIDER_SITE_OTHER): Payer: 59 | Admitting: Advanced Practice Midwife

## 2022-09-28 ENCOUNTER — Other Ambulatory Visit: Payer: Self-pay

## 2022-09-28 ENCOUNTER — Encounter: Payer: Self-pay | Admitting: Advanced Practice Midwife

## 2022-09-28 VITALS — BP 111/70 | HR 79 | Wt 164.1 lb

## 2022-09-28 DIAGNOSIS — Z975 Presence of (intrauterine) contraceptive device: Secondary | ICD-10-CM | POA: Diagnosis not present

## 2022-09-28 DIAGNOSIS — Z30431 Encounter for routine checking of intrauterine contraceptive device: Secondary | ICD-10-CM

## 2022-09-28 DIAGNOSIS — N921 Excessive and frequent menstruation with irregular cycle: Secondary | ICD-10-CM

## 2022-09-28 DIAGNOSIS — N946 Dysmenorrhea, unspecified: Secondary | ICD-10-CM | POA: Diagnosis not present

## 2022-09-28 LAB — CBC
Hematocrit: 41 % (ref 34.0–46.6)
Hemoglobin: 12.8 g/dL (ref 11.1–15.9)
MCH: 29.3 pg (ref 26.6–33.0)
MCHC: 31.2 g/dL — ABNORMAL LOW (ref 31.5–35.7)
MCV: 94 fL (ref 79–97)
Platelets: 327 10*3/uL (ref 150–450)
RBC: 4.37 x10E6/uL (ref 3.77–5.28)
RDW: 13.2 % (ref 11.7–15.4)
WBC: 6.8 10*3/uL (ref 3.4–10.8)

## 2022-09-28 LAB — POCT PREGNANCY, URINE: Preg Test, Ur: NEGATIVE

## 2022-09-28 MED ORDER — NORGESTIMATE-ETH ESTRADIOL 0.25-35 MG-MCG PO TABS
1.0000 | ORAL_TABLET | Freq: Every day | ORAL | 2 refills | Status: DC
Start: 2022-09-28 — End: 2023-09-26

## 2022-09-28 MED ORDER — IBUPROFEN 600 MG PO TABS
600.0000 mg | ORAL_TABLET | Freq: Four times a day (QID) | ORAL | Status: AC | PRN
Start: 2022-09-28 — End: ?

## 2022-09-28 NOTE — Progress Notes (Signed)
    GYNECOLOGY OFFICE ENCOUNTER NOTE  History:  Amber Alvarez is a 22 y.o. year old female here today for today for IUD string check; ParaGard IUD was placed 07/13/22.   Reports the following since IUD insertion: Bleeding moderate, lasting 6-15 days, mod-heavy. Has distinct stretches with no bleeding. Light bleeding today.  Abd pain: severe cramping w/ onset on menses. None now.  Fever: denies Dyspareunia: Denies Mild dizziness  The following portions of the patient's history were reviewed and updated as appropriate: allergies, current medications, past family history, past medical history, past social history, past surgical history and problem list. Last pap smear on 03/30/21 was normal.   Review of Systems:  Pertinent items are noted in HPI.   Objective:  Physical Exam Blood pressure 101/70, pulse 75, height 5\' 4"  (1.626 m), weight 160 lb (72.6 kg), last menstrual period 03/23/2018, currently breastfeeding. CONSTITUTIONAL: Well-developed, well-nourished female in no acute distress.  SKIN: No pallor HENT:  Normocephalic, atraumatic. Oropharynx is clear and moist EYES: Conjunctivae. No scleral icterus.  CARDIOVASCULAR: Normal heart rate noted RESPIRATORY: Effort normal, no problems with respiration noted ABDOMEN: Soft, no distention noted.   PELVIC: Normal appearing external genitalia; normal appearing vaginal mucosa. Scant bleeding. Pt intolerant of speculum exam. Bimanual exam performed. Strings palpated. IUD tip non palpated. No CMT.   Assessment & Plan:  Pt with heavy and prolonged periods 2 1/2 months after ParaGard placement. Bleeding sounds typical for ParaGard but will check CBC to make sure pt hasn't become anemic. Offered pelvic US to check placement but pt and spouse wish to wait for now.  Rx OCPs for AUB on IUD. Take one pill daily x 28 days Recommend IBU 600 mg Q6 hours x first 3 days of period to help with dysmenorrhea and AUB.  Patient to keep IUD in place for up to 10  years; can come in for removal if she desires pregnancy earlier or for any concerning side effects.  Katrinka Blazing, [place], CNM 09/28/2022 9:06 AM

## 2022-11-21 IMAGING — US US ABDOMEN LIMITED
1 series · 15 of 25 positions shown · non-contrast
Comparison: None.

CLINICAL DATA: Right upper quadrant pain pregnant patient

EXAM:
ULTRASOUND ABDOMEN LIMITED RIGHT UPPER QUADRANT

[Series 1: us abdomen limited · 15 of 37 slices shown]
[im 1/37]
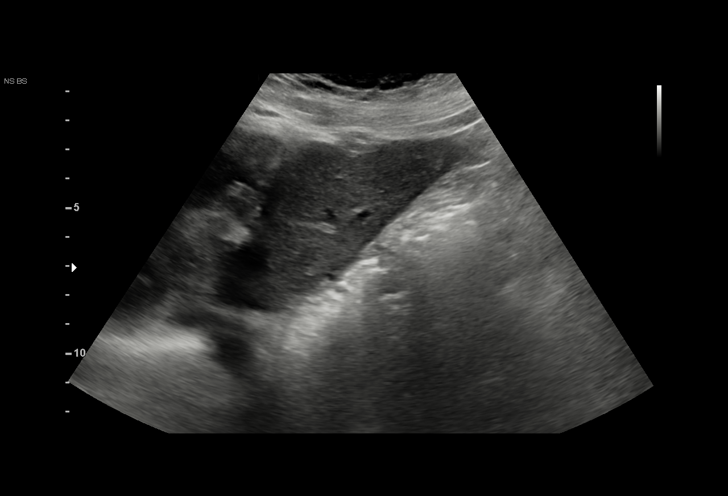
[im 4/37]
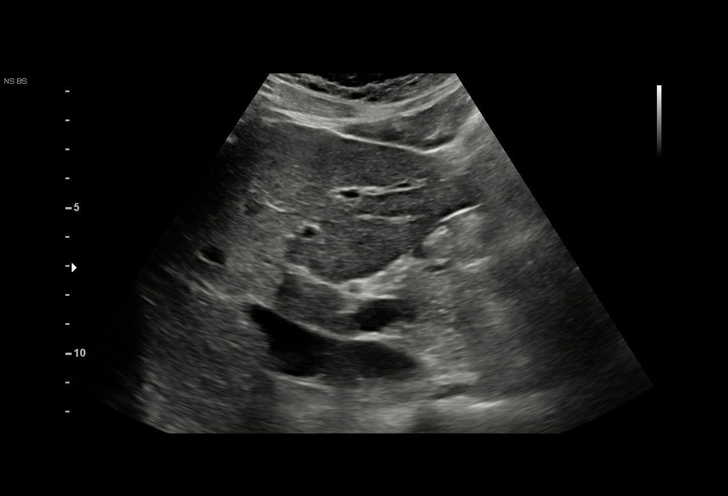
[im 7/37]
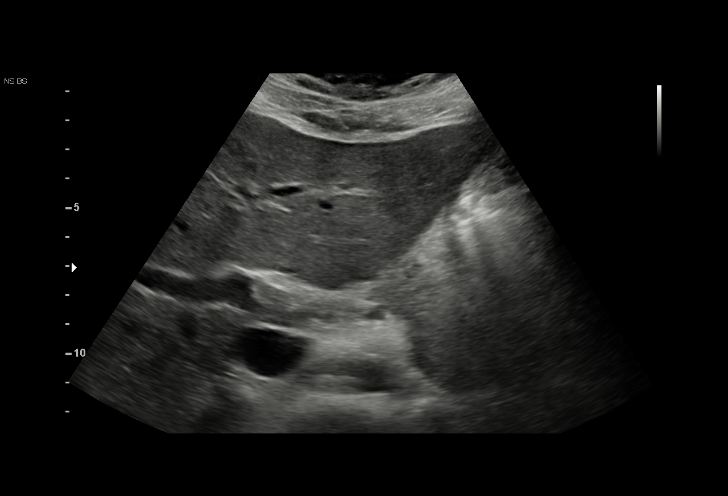
[im 8/37]
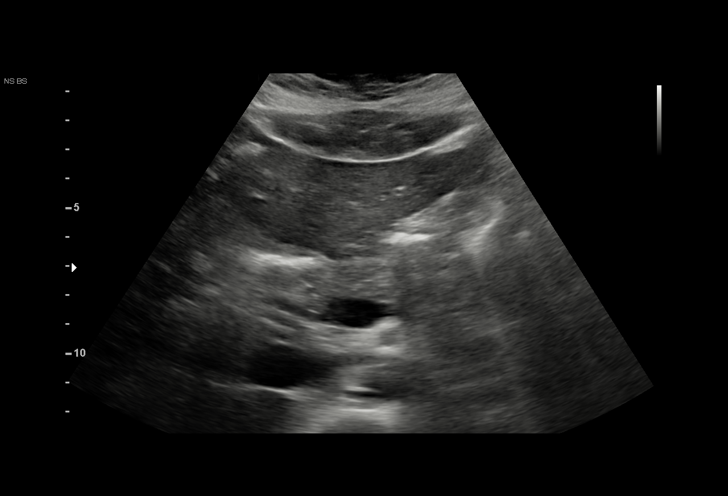
[im 11/37]
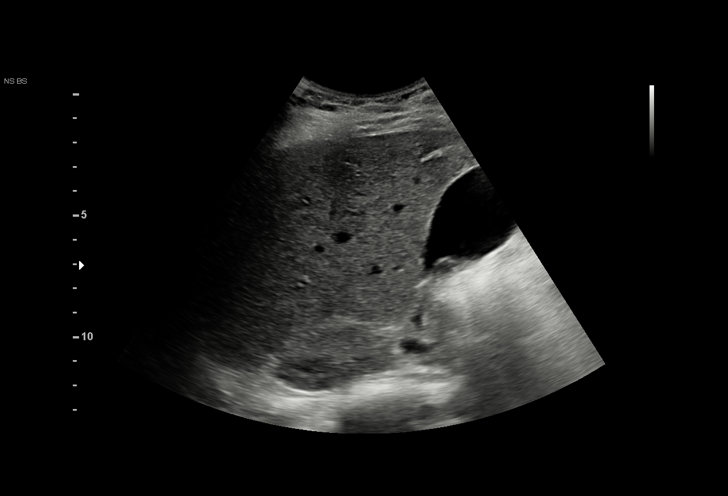
[im 14/37]
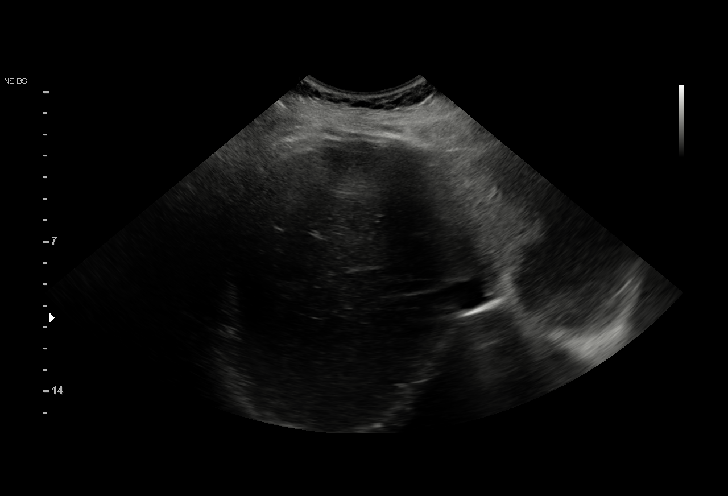
[im 16/37]
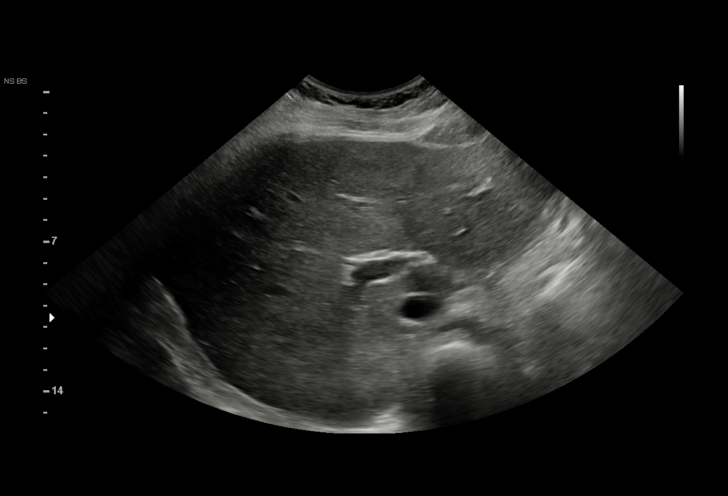
[im 19/37]
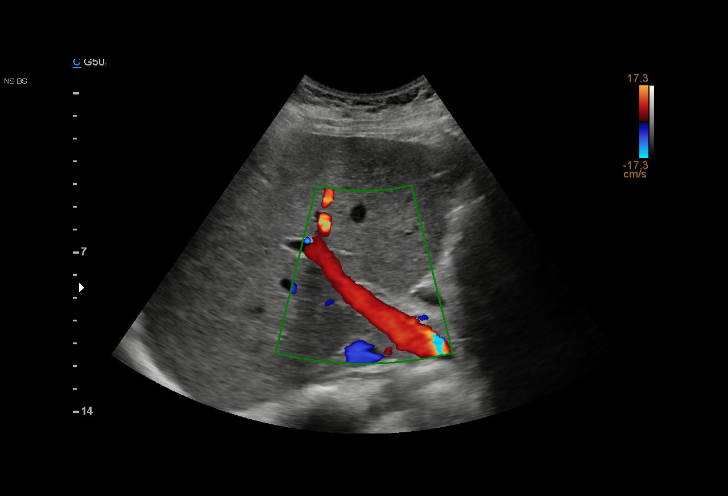
[im 22/37]
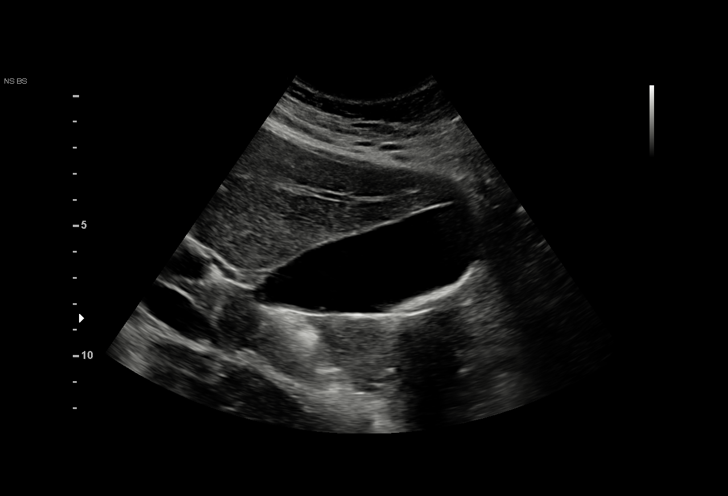
[im 23/37]
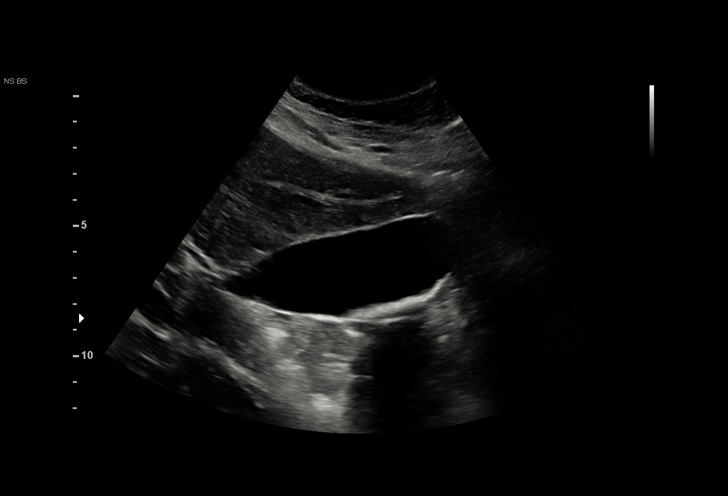
[im 26/37]
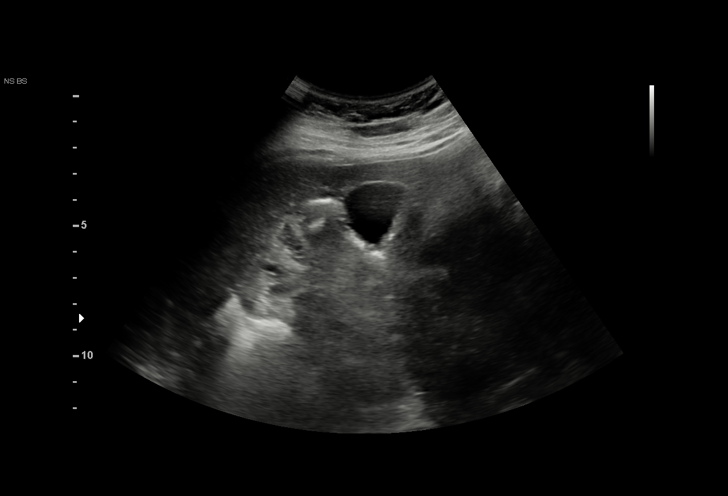
[im 29/37]
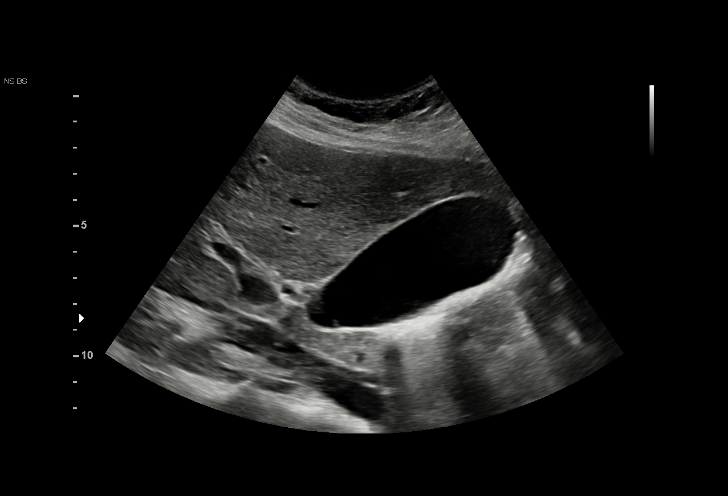
[im 31/37]
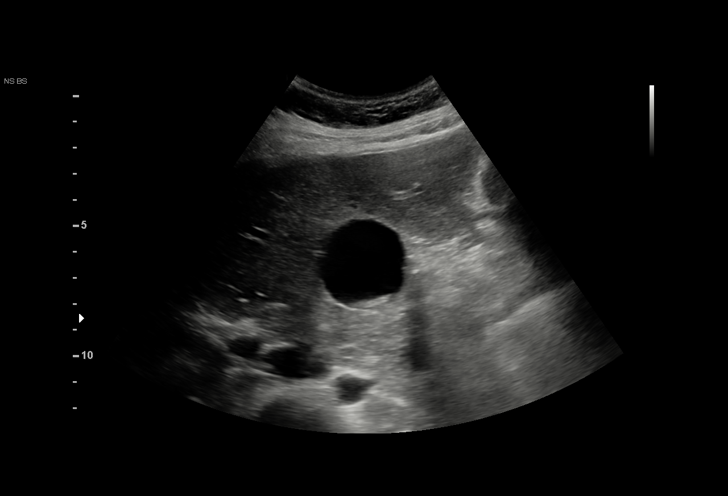
[im 34/37]
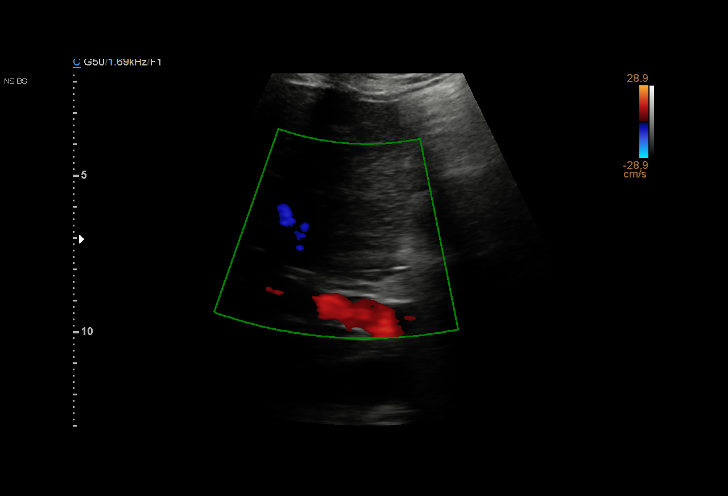
[im 37/37]
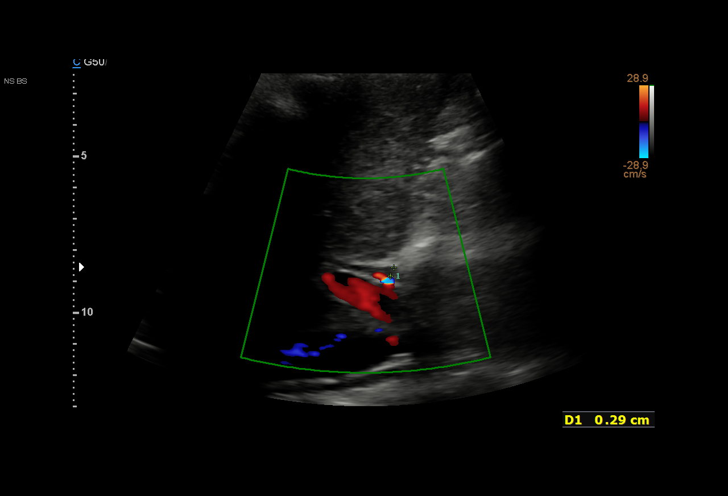

[15 of 25 positions shown; findings below may reference images not displayed]

FINDINGS: Gallbladder:

Sandlike stones in the gallbladder. Normal wall thickness. Negative
sonographic Murphy.

Common bile duct:

Diameter: 2.9 mm

Liver:

No focal lesion identified. Within normal limits in parenchymal
echogenicity. Portal vein is patent on color Doppler imaging with
normal direction of blood flow towards the liver.

Other: None.
IMPRESSION: Cholelithiasis without sonographic evidence for acute cholecystitis.

## 2023-01-30 ENCOUNTER — Encounter: Payer: Self-pay | Admitting: Obstetrics & Gynecology

## 2023-01-30 ENCOUNTER — Ambulatory Visit (INDEPENDENT_AMBULATORY_CARE_PROVIDER_SITE_OTHER): Payer: 59 | Admitting: Obstetrics & Gynecology

## 2023-01-30 VITALS — BP 103/69 | HR 59 | Wt 144.4 lb

## 2023-01-30 DIAGNOSIS — Z30017 Encounter for initial prescription of implantable subdermal contraceptive: Secondary | ICD-10-CM

## 2023-01-30 DIAGNOSIS — R102 Pelvic and perineal pain: Secondary | ICD-10-CM

## 2023-01-30 DIAGNOSIS — Z30432 Encounter for removal of intrauterine contraceptive device: Secondary | ICD-10-CM | POA: Diagnosis not present

## 2023-01-30 DIAGNOSIS — N921 Excessive and frequent menstruation with irregular cycle: Secondary | ICD-10-CM | POA: Diagnosis not present

## 2023-01-30 MED ORDER — ETONOGESTREL 68 MG ~~LOC~~ IMPL
68.0000 mg | DRUG_IMPLANT | Freq: Once | SUBCUTANEOUS | Status: AC
  Administered 2023-01-30: 68 mg via SUBCUTANEOUS

## 2023-01-30 NOTE — Progress Notes (Signed)
    GYNECOLOGY OFFICE PROCEDURE NOTE  Amber Alvarez is a 22 y.o. G3P2002 here for Paragard  IUD removal due to pelvic pain, prolonged bleeding. This was placed in 07/13/2022. Desires Nexplanon  placement.  No other GYN concerns.  Last pap smear was on 03/30/2021 and was normal.  Patient is accompanied by her husband, he is the one who interprets for her as she speaks Urdu (signed refusal of interpreter form).  Had a long discussion with patient about side effects of Nexplanon ; 3 -6 months of irregular bleeding, followed by lighter menses of no periods.  Irregular bleeding can persist in a few patients, this is the number one reason for removal.  Also discussed hormonal side effects: weight changes, skin changes, breast tenderness etc.  She and her husband verbalized understanding, want to proceed with Nexplanon  placement.  IUD Removal  Patient identified, informed consent performed, consent signed.   Chaperone present.  Patient was placed in the dorsal lithotomy position, normal external genitalia was noted.  A speculum was placed in the patient's vagina, normal discharge was noted, no lesions. The cervix was visualized, no lesions, no abnormal discharge.  The strings of the IUD were grasped and pulled using ring forceps. The IUD was removed in its entirety. Patient tolerated the procedure well.    Nexplanon  Insertion Procedure Patient identified, informed consent performed, consent signed.   Patient does understand that irregular bleeding is a very common side effect of this medication. Discussed other risks and benefits of this contraception modality. She was advised to have backup contraception for one week after placement. Pregnancy test in clinic today was negative.  Appropriate time out taken.  Patient's left arm was prepped and draped in the usual sterile fashion. The ruler used to measure and mark insertion area.  Patient was prepped with alcohol swab and then injected with 3 ml of 1% lidocaine .  She  was prepped with betadine , Nexplanon  removed from packaging,  Device confirmed in needle, then inserted full length of needle and withdrawn per handbook instructions. Nexplanon  was able to palpated in the patient's arm; patient palpated the insert herself. There was minimal blood loss.  Patient insertion site covered with guaze and a pressure bandage to reduce any bruising.  The patient tolerated the procedure well and was given post procedure instructions.     GLORIS HUGGER, MD, FACOG Obstetrician & Gynecologist, [facility] for [name], [name]

## 2023-01-30 NOTE — Patient Instructions (Signed)
 Nexplanon Instructions After Insertion  Keep bandage clean and dry for 24 hours  May use ice/Tylenol/Ibuprofen for soreness or pain  If you develop fever, drainage or increased warmth from incision site-contact office immediately

## 2023-02-08 ENCOUNTER — Telehealth: Payer: Self-pay | Admitting: Lactation Services

## 2023-02-08 NOTE — Telephone Encounter (Signed)
 Received refill request for Ondansetron ODT. Patient is not longer pregnant. Called Pharmacy to cancel refill request.

## 2023-09-07 DIAGNOSIS — G44209 Tension-type headache, unspecified, not intractable: Secondary | ICD-10-CM | POA: Diagnosis not present

## 2023-09-07 DIAGNOSIS — Z0000 Encounter for general adult medical examination without abnormal findings: Secondary | ICD-10-CM | POA: Diagnosis not present

## 2023-09-07 DIAGNOSIS — J309 Allergic rhinitis, unspecified: Secondary | ICD-10-CM | POA: Diagnosis not present

## 2023-09-07 DIAGNOSIS — Z13220 Encounter for screening for lipoid disorders: Secondary | ICD-10-CM | POA: Diagnosis not present

## 2023-09-07 DIAGNOSIS — R202 Paresthesia of skin: Secondary | ICD-10-CM | POA: Diagnosis not present

## 2023-09-07 DIAGNOSIS — E611 Iron deficiency: Secondary | ICD-10-CM | POA: Diagnosis not present

## 2023-09-26 ENCOUNTER — Other Ambulatory Visit: Payer: Self-pay

## 2023-09-26 ENCOUNTER — Ambulatory Visit: Admitting: Obstetrics & Gynecology

## 2023-09-26 ENCOUNTER — Encounter: Payer: Self-pay | Admitting: Obstetrics & Gynecology

## 2023-09-26 VITALS — BP 109/71 | HR 77 | Ht 64.0 in | Wt 152.0 lb

## 2023-09-26 DIAGNOSIS — Z309 Encounter for contraceptive management, unspecified: Secondary | ICD-10-CM

## 2023-09-26 DIAGNOSIS — Z3046 Encounter for surveillance of implantable subdermal contraceptive: Secondary | ICD-10-CM | POA: Diagnosis not present

## 2023-09-26 MED ORDER — PHEXXI 1.8-1-0.4 % VA GEL
VAGINAL | 2 refills | Status: AC
Start: 2023-09-26 — End: ?
  Filled 2023-09-26: qty 60, 30d supply, fill #0

## 2023-09-26 MED ORDER — PHEXXI 1.8-1-0.4 % VA GEL
VAGINAL | 2 refills | Status: DC
Start: 2023-09-26 — End: 2023-09-26
  Filled 2023-09-26: qty 60, fill #0

## 2023-09-26 NOTE — Progress Notes (Signed)
    GYNECOLOGY OFFICE PROCEDURE NOTE  Amber Alvarez is a 23 y.o. G3P2002 here for Nexplanon  removal, inserted 01/30/2023.  She had significant mood swings and headaches.  Was amenorrheic on it. Does not want any more hormonal methods.  Last pap smear was on 03/30/2021 and was normal with negative HRHPV.  No other gynecologic concerns.  Patient is accompanied by her husband, he is the one who interprets for her as she speaks Urdu (signed refusal of interpreter form).   Nexplanon  Removal Patient identified, informed consent performed, consent signed.   Appropriate time out taken. Nexplanon  site identified.  Area prepped in usual sterile fashon. One ml of 1% lidocaine  was used to anesthetize the area at the distal end of the implant. A small stab incision was made right beside the implant on the distal portion.  The Nexplanon  rod was grasped using hemostats and removed without difficulty.  There was minimal blood loss. There were no complications.  3 ml of 1% lidocaine  was injected around the incision for post-procedure analgesia.  Steri-strips were applied over the small incision.  A pressure bandage was applied to reduce any bruising.  The patient tolerated the procedure well and was given post procedure instructions.    Discussed contraceptive options with patient and her husband.  They will use a combination of condoms and fetility awareness method. Also informed them about Phexxi , they agreed for this to be prescribed.    Patient was told to return for any gynecologic concerns.      GLORIS HUGGER, MD, FACOG Obstetrician & Gynecologist, [facility] for [name], [facility]

## 2023-09-27 ENCOUNTER — Other Ambulatory Visit: Payer: Self-pay

## 2023-10-04 ENCOUNTER — Other Ambulatory Visit: Payer: Self-pay

## 2024-02-27 ENCOUNTER — Other Ambulatory Visit: Payer: Self-pay | Admitting: Internal Medicine

## 2024-02-27 DIAGNOSIS — J329 Chronic sinusitis, unspecified: Secondary | ICD-10-CM
